# Patient Record
Sex: Female | Born: 1995 | Race: White | Hispanic: No | Marital: Single | Smoking: Current every day smoker
Health system: Southern US, Community
[De-identification: ages and names within clinical notes are randomized; demographics above are authoritative.]

## PROBLEM LIST (undated history)

## (undated) DIAGNOSIS — R000 Tachycardia, unspecified: Secondary | ICD-10-CM

## (undated) DIAGNOSIS — M1990 Unspecified osteoarthritis, unspecified site: Secondary | ICD-10-CM

## (undated) HISTORY — PX: KNEE SURGERY: SHX244

---
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
## 2015-07-20 ENCOUNTER — Encounter (HOSPITAL_COMMUNITY): Payer: Self-pay | Admitting: Emergency Medicine

## 2015-07-20 ENCOUNTER — Emergency Department (HOSPITAL_COMMUNITY): Payer: Medicaid Other

## 2015-07-20 ENCOUNTER — Emergency Department (HOSPITAL_COMMUNITY)
Admission: EM | Admit: 2015-07-20 | Discharge: 2015-07-20 | Disposition: A | Discharge status: Home / Self Care | Payer: Medicaid Other | Attending: Emergency Medicine | Admitting: Emergency Medicine

## 2015-07-20 DIAGNOSIS — Z79899 Other long term (current) drug therapy: Secondary | ICD-10-CM | POA: Insufficient documentation

## 2015-07-20 DIAGNOSIS — J029 Acute pharyngitis, unspecified: Secondary | ICD-10-CM | POA: Insufficient documentation

## 2015-07-20 DIAGNOSIS — R509 Fever, unspecified: Secondary | ICD-10-CM | POA: Insufficient documentation

## 2015-07-20 DIAGNOSIS — R6889 Other general symptoms and signs: Secondary | ICD-10-CM

## 2015-07-20 DIAGNOSIS — M1990 Unspecified osteoarthritis, unspecified site: Secondary | ICD-10-CM | POA: Insufficient documentation

## 2015-07-20 DIAGNOSIS — Z3202 Encounter for pregnancy test, result negative: Secondary | ICD-10-CM | POA: Insufficient documentation

## 2015-07-20 DIAGNOSIS — R112 Nausea with vomiting, unspecified: Secondary | ICD-10-CM | POA: Insufficient documentation

## 2015-07-20 DIAGNOSIS — R0981 Nasal congestion: Secondary | ICD-10-CM | POA: Insufficient documentation

## 2015-07-20 DIAGNOSIS — R05 Cough: Secondary | ICD-10-CM | POA: Insufficient documentation

## 2015-07-20 DIAGNOSIS — R197 Diarrhea, unspecified: Secondary | ICD-10-CM | POA: Insufficient documentation

## 2015-07-20 LAB — COMPREHENSIVE METABOLIC PANEL
ALBUMIN: 3.8 g/dL (ref 3.5–5.0)
ALT: 17 U/L (ref 14–54)
AST: 19 U/L (ref 15–41)
Alkaline Phosphatase: 75 U/L (ref 38–126)
Anion gap: 11 (ref 5–15)
BUN: 8 mg/dL (ref 6–20)
CHLORIDE: 104 mmol/L (ref 101–111)
CO2: 26 mmol/L (ref 22–32)
CREATININE: 0.78 mg/dL (ref 0.44–1.00)
Calcium: 9.2 mg/dL (ref 8.9–10.3)
GFR calc Af Amer: >60 mL/min (ref >60)
GLUCOSE: 99 mg/dL (ref 65–99)
POTASSIUM: 4.4 mmol/L (ref 3.5–5.1)
SODIUM: 141 mmol/L (ref 135–145)
Total Bilirubin: 0.4 mg/dL (ref 0.3–1.2)
Total Protein: 6.5 g/dL (ref 6.5–8.1)

## 2015-07-20 LAB — CBC
HEMATOCRIT: 40.9 % (ref 36.0–46.0)
Hemoglobin: 13.8 g/dL (ref 12.0–15.0)
MCH: 29.7 pg (ref 26.0–34.0)
MCHC: 33.7 g/dL (ref 30.0–36.0)
MCV: 88 fL (ref 78.0–100.0)
PLATELETS: 250 10*3/uL (ref 150–400)
RBC: 4.65 MIL/uL (ref 3.87–5.11)
RDW: 12.3 % (ref 11.5–15.5)
WBC: 6.5 10*3/uL (ref 4.0–10.5)

## 2015-07-20 LAB — URINALYSIS, ROUTINE W REFLEX MICROSCOPIC
Bilirubin Urine: NEGATIVE
GLUCOSE, UA: NEGATIVE mg/dL
HGB URINE DIPSTICK: NEGATIVE
KETONES UR: NEGATIVE mg/dL
LEUKOCYTES UA: NEGATIVE
Nitrite: NEGATIVE
PH: 6 (ref 5.0–8.0)
Protein, ur: NEGATIVE mg/dL
Specific Gravity, Urine: 1.012 (ref 1.005–1.030)

## 2015-07-20 LAB — I-STAT CG4 LACTIC ACID, ED: LACTIC ACID, VENOUS: 0.49 mmol/L — AB (ref 0.5–2.0)

## 2015-07-20 LAB — I-STAT BETA HCG BLOOD, ED (MC, WL, AP ONLY): I-stat hCG, quantitative: <5 m[IU]/mL (ref <5)

## 2015-07-20 LAB — RAPID STREP SCREEN (MED CTR MEBANE ONLY): STREPTOCOCCUS, GROUP A SCREEN (DIRECT): NEGATIVE

## 2015-07-20 LAB — LIPASE, BLOOD: LIPASE: 25 U/L (ref 11–51)

## 2015-07-20 MED ORDER — IBUPROFEN 800 MG PO TABS: 800.0000 mg | ORAL_TABLET | Freq: Three times a day (TID) | ORAL | Status: DC

## 2015-07-20 MED ORDER — ONDANSETRON 4 MG PO TBDP: 4.0000 mg | ORAL_TABLET | Freq: Three times a day (TID) | ORAL | Status: DC | PRN

## 2015-07-20 MED ORDER — BENZONATATE 100 MG PO CAPS
100.0000 mg | ORAL_CAPSULE | Freq: Once | ORAL | Status: AC
  Administered 2015-07-20: 100 mg via ORAL
  Filled 2015-07-20: qty 1

## 2015-07-20 MED ORDER — ONDANSETRON 4 MG PO TBDP
4.0000 mg | ORAL_TABLET | Freq: Once | ORAL | Status: AC
Start: 2015-07-20 — End: 2015-07-20
  Administered 2015-07-20: 4 mg via ORAL
  Filled 2015-07-20: qty 1

## 2015-07-20 MED ORDER — BENZONATATE 100 MG PO CAPS: 100.0000 mg | ORAL_CAPSULE | Freq: Three times a day (TID) | ORAL | Status: DC

## 2015-07-20 NOTE — ED Notes (Signed)
Pt sts fever and body aches with N/V starting today

## 2015-07-20 NOTE — Discharge Instructions (Signed)
You have been seen today for flulike symptoms. Your imaging and lab tests showed no abnormalities. Your symptoms are consistent with a viral illness. Viruses do not require antibiotics. Treatment is symptomatic care. Drink plenty of fluids and get plenty of rest. Ibuprofen or Tylenol for pain or fever. Zofran for nausea. Tessalon for cough. Warm liquids or Chloraseptic spray may help soothe the sore throat. Follow up with PCP as needed. Return to ED should symptoms worsen.

## 2015-07-20 NOTE — ED Provider Notes (Signed)
CSN: [id number]     Arrival date & time 07/20/15  1806 History  By signing my name below, I, Lyndel Safe, attest that this documentation has been prepared under the direction and in the presence of Jalisia Puchalski, PA-C. Electronically Signed: Lyndel Safe, ED Scribe. 07/20/2015. 8:13 PM.  Chief Complaint  Patient presents with  . Fever  . Emesis   The history is provided by the patient. No language interpreter was used.   HPI Comments: Virginia [name] is a 20 y.o. female who presents to the Emergency Department complaining of a fever with a Tmax of 102 F onset this morning with waking up. Pt reports she has been experiencing nasal congestion and a cough over the past week and she associates a sore throat, diarrhea, nausea and vomiting, with 3-4 episodes of emesis over the past 24 hours. She has taken multiple OTC cold and flu medications without long term relief. She denies urinary symptoms, rashes, abdominal pain, or any other complaints. Pt able to tolerate secretions without difficulty. No trouble breathing.   Past Medical History  Diagnosis Date  . Arthritis   . Tachycardia    Past Surgical History  Procedure Laterality Date  . Knee surgery     History reviewed. No pertinent family history. Social History  Substance Use Topics  . Smoking status: Never Smoker   . Smokeless tobacco: None  . Alcohol Use: No   OB History    No data available     Review of Systems  Constitutional: Positive for fever.  HENT: Positive for sore throat. Negative for trouble swallowing and voice change.   Respiratory: Positive for cough. Negative for shortness of breath.   Gastrointestinal: Positive for nausea, vomiting and diarrhea.  Genitourinary: Negative for dysuria, urgency, frequency and hematuria.  Skin: Negative for rash.  All other systems reviewed and are negative.  Allergies  Ceclor  Home Medications   Prior to Admission medications   Medication Sig Start Date End Date Taking?  Authorizing Provider  benzonatate (TESSALON) 100 MG capsule Take 1 capsule (100 mg total) by mouth every 8 (eight) hours. 07/20/15   Ramaj Frangos C Kismet Facemire, PA-C  ibuprofen (ADVIL,MOTRIN) 600 MG tablet Take 600 mg by mouth every 6 (six) hours as needed. For pain    Historical Provider, MD  ibuprofen (ADVIL,MOTRIN) 800 MG tablet Take 1 tablet (800 mg total) by mouth 3 (three) times daily. 07/20/15   Aracelie Addis C Ariyanna Oien, PA-C  ondansetron (ZOFRAN ODT) 4 MG disintegrating tablet Take 1 tablet (4 mg total) by mouth every 8 (eight) hours as needed for nausea or vomiting. 07/20/15   Anselm Pancoast, PA-C  Pediatric Multivit-Minerals-C (GUMMI BEAR MULTIVITAMIN/MIN) CHEW Chew 2 each by mouth daily.    Historical Provider, MD  Rayburn Ma Oil (STYE) 31.9-57.7 % OINT Apply 1 application to eye daily as needed. For stye    Historical Provider, MD   BP 118/74 mmHg  Pulse 103  Temp(Src) 98.7 F (37.1 C) (Oral)  Resp 18  SpO2 99% Physical Exam  Constitutional: She is oriented to person, place, and time. She appears well-developed and well-nourished. No distress.  HENT:  Head: Normocephalic.  Mouth/Throat: Oropharynx is clear and moist. No oropharyngeal exudate, posterior oropharyngeal edema or posterior oropharyngeal erythema.  Oropharynx clear of edema, erythema and exudate.   Eyes: Conjunctivae are normal.  Neck: Normal range of motion. Neck supple.  Cardiovascular: Normal rate, regular rhythm and normal heart sounds.   No peripheral edema.   Pulmonary/Chest: Effort normal and  breath sounds normal. No respiratory distress. She has no wheezes. She has no rales.  Abdominal: Soft. There is no tenderness.  Musculoskeletal: Normal range of motion.  Lymphadenopathy:    She has no cervical adenopathy.  Neurological: She is alert and oriented to person, place, and time. Coordination normal.  Skin: Skin is warm.  Psychiatric: She has a normal mood and affect. Her behavior is normal.  Nursing note and vitals  reviewed.   ED Course  Procedures  DIAGNOSTIC STUDIES: Oxygen Saturation is 99% on RA, normal by my interpretation.    COORDINATION OF CARE: 7:34 PM Discussed treatment plan which includes to order symptomatic relief and chest Xray with pt. Pt acknowledges and agrees to plan.   Labs Review Labs Reviewed  I-STAT CG4 LACTIC ACID, ED - Abnormal; Notable for the following:    Lactic Acid, Venous 0.49 (*)    All other components within normal limits  RAPID STREP SCREEN (NOT AT [facility])  CULTURE, [facility]REP ([facility])  LIPASE, BLOOD  COMPREHENSIVE METABOLIC PANEL  CBC  URINALYSIS, ROUTINE W REFLEX MICROSCOPIC (NOT AT [facility])  I-STAT BETA HCG BLOOD, ED ([facility], [facility], [facility] ONLY)  I-STAT CG4 LACTIC ACID, ED    Imaging Review Dg Chest 2 View  07/20/2015  CLINICAL DATA:  Cough and fever for 1 week. EXAM: CHEST  2 VIEW COMPARISON:  None. FINDINGS: The cardiomediastinal contours are normal. The lungs are clear. Pulmonary vasculature is normal. No consolidation, pleural effusion, or pneumothorax. No acute osseous abnormalities are seen. IMPRESSION: No acute pulmonary process. Electronically Signed   By: Rubye Oaks M.D.   On: 07/20/2015 19:59   I have personally reviewed and evaluated these images and lab results as part of my medical decision-making.   MDM   Final diagnoses:  Flu-like symptoms    Virginia [name] presents with fever, nausea, vomiting, sore throat and diarrhea since this morning. Cough for the last week.  This patient's presentation is consistent with a viral illness, such as influenza. Patient is nontoxic appearing, afebrile, not tachycardic, not tachypneic, maintains SPO2 of 99% on room air, and is in no apparent distress. Patient has no signs of sepsis or other serious or life-threatening condition. Patient improved with conservative management here in the ED. The patient was given instructions for home care as well as return precautions. Patient voices understanding of these  instructions, accepts the plan, and is comfortable with discharge.  Filed Vitals:   07/20/15 1812 07/20/15 1817 07/20/15 2129  BP:  118/74 113/71  Pulse: 103  99  Temp: 98.7 F (37.1 C)  98.5 F (36.9 C)  TempSrc: Oral  Oral  Resp: 18  18  SpO2: 99%  99%     I personally performed the services described in this documentation, which was scribed in my presence. The recorded information has been reviewed and is accurate.   Anselm Pancoast, PA-C 07/20/15 2359  Nelva Nay, MD 07/23/15 1351

## 2015-07-23 LAB — CULTURE, GROUP A STREP (THRC)

## 2015-12-23 ENCOUNTER — Emergency Department (HOSPITAL_COMMUNITY)
Admission: EM | Admit: 2015-12-23 | Discharge: 2015-12-23 | Disposition: A | Discharge status: Home / Self Care | Payer: Medicaid Other | Attending: Dermatology | Admitting: Dermatology

## 2015-12-23 ENCOUNTER — Encounter (HOSPITAL_COMMUNITY): Payer: Self-pay

## 2015-12-23 DIAGNOSIS — R1110 Vomiting, unspecified: Secondary | ICD-10-CM | POA: Insufficient documentation

## 2015-12-23 DIAGNOSIS — Z5321 Procedure and treatment not carried out due to patient leaving prior to being seen by health care provider: Secondary | ICD-10-CM | POA: Insufficient documentation

## 2015-12-23 LAB — URINALYSIS, ROUTINE W REFLEX MICROSCOPIC
Bilirubin Urine: NEGATIVE
Glucose, UA: NEGATIVE mg/dL
Hgb urine dipstick: NEGATIVE
KETONES UR: 15 mg/dL — AB
LEUKOCYTES UA: NEGATIVE
NITRITE: NEGATIVE
PH: 7 (ref 5.0–8.0)
Protein, ur: NEGATIVE mg/dL
SPECIFIC GRAVITY, URINE: 1.021 (ref 1.005–1.030)

## 2015-12-23 LAB — COMPREHENSIVE METABOLIC PANEL
ALK PHOS: 55 U/L (ref 38–126)
ALT: 16 U/L (ref 14–54)
ANION GAP: 8 (ref 5–15)
AST: 16 U/L (ref 15–41)
Albumin: 4.1 g/dL (ref 3.5–5.0)
BILIRUBIN TOTAL: 0.7 mg/dL (ref 0.3–1.2)
BUN: 10 mg/dL (ref 6–20)
CALCIUM: 9.2 mg/dL (ref 8.9–10.3)
CO2: 24 mmol/L (ref 22–32)
Chloride: 107 mmol/L (ref 101–111)
Creatinine, Ser: 0.76 mg/dL (ref 0.44–1.00)
Glucose, Bld: 97 mg/dL (ref 65–99)
POTASSIUM: 3.8 mmol/L (ref 3.5–5.1)
Sodium: 139 mmol/L (ref 135–145)
TOTAL PROTEIN: 6.8 g/dL (ref 6.5–8.1)

## 2015-12-23 LAB — CBC
HEMATOCRIT: 40.5 % (ref 36.0–46.0)
HEMOGLOBIN: 13.2 g/dL (ref 12.0–15.0)
MCH: 29.2 pg (ref 26.0–34.0)
MCHC: 32.6 g/dL (ref 30.0–36.0)
MCV: 89.6 fL (ref 78.0–100.0)
Platelets: 298 10*3/uL (ref 150–400)
RBC: 4.52 MIL/uL (ref 3.87–5.11)
RDW: 12.3 % (ref 11.5–15.5)
WBC: 9.2 10*3/uL (ref 4.0–10.5)

## 2015-12-23 LAB — LIPASE, BLOOD: Lipase: 25 U/L (ref 11–51)

## 2015-12-23 LAB — POC URINE PREG, ED: PREG TEST UR: NEGATIVE

## 2015-12-23 MED ORDER — ONDANSETRON 4 MG PO TBDP
4.0000 mg | ORAL_TABLET | Freq: Once | ORAL | Status: AC | PRN
  Administered 2015-12-23: 4 mg via ORAL

## 2015-12-23 MED ORDER — ONDANSETRON 4 MG PO TBDP
ORAL_TABLET | ORAL | Status: DC
Start: 2015-12-23 — End: 2015-12-24
  Filled 2015-12-23: qty 1

## 2015-12-23 NOTE — ED Triage Notes (Signed)
Pt states emesis x 2 weeks. States syncopal episodes yesterday. Pt denies any abdominal pain or urinary symptoms.

## 2015-12-23 NOTE — ED Notes (Signed)
Patient left stated that she no longer wanted to be seen.Virginia [name]

## 2016-03-19 ENCOUNTER — Emergency Department (HOSPITAL_COMMUNITY)
Admission: EM | Admit: 2016-03-19 | Discharge: 2016-03-20 | Disposition: A | Discharge status: Home / Self Care | Payer: Medicaid Other | Attending: Emergency Medicine | Admitting: Emergency Medicine

## 2016-03-19 ENCOUNTER — Encounter (HOSPITAL_COMMUNITY): Payer: Self-pay

## 2016-03-19 DIAGNOSIS — A6004 Herpesviral vulvovaginitis: Secondary | ICD-10-CM | POA: Insufficient documentation

## 2016-03-19 LAB — WET PREP, GENITAL
Sperm: NONE SEEN
TRICH WET PREP: NONE SEEN
Yeast Wet Prep HPF POC: NONE SEEN

## 2016-03-19 MED ORDER — VALACYCLOVIR HCL 500 MG PO TABS
1000.0000 mg | ORAL_TABLET | Freq: Once | ORAL | Status: AC
  Administered 2016-03-19: 1000 mg via ORAL
  Filled 2016-03-19: qty 2

## 2016-03-19 MED ORDER — AZITHROMYCIN 1 G PO PACK
2.0000 g | PACK | Freq: Once | ORAL | Status: AC
  Administered 2016-03-19: 2 g via ORAL
  Filled 2016-03-19: qty 2

## 2016-03-19 MED ORDER — ACYCLOVIR 200 MG PO CAPS: 200.0000 mg | ORAL_CAPSULE | Freq: Every day | ORAL | 0 refills | Status: DC

## 2016-03-19 NOTE — ED Provider Notes (Addendum)
[facility]-EMERGENCY DEPT Provider Note   CSN: [id number] Arrival date & time: 03/19/16  1914  By signing my name below, I, [name], attest that this documentation has been prepared under the direction and in the presence of Tomasita Crumble, MD . Electronically Signed: [name], Scribe. 03/19/2016. 11:11 PM.  History   Chief Complaint Chief Complaint  Patient presents with  . SEXUALLY TRANSMITTED DISEASE    The history is provided by the patient and medical records. No language interpreter was used.    HPI Comments:  Virginia [name] is a 20 y.o. female who presents to the Emergency Department complaining of a rash on her pelvic area onset last night.  Pt states that she shaved her legs last night and had a razor burn, but when woke up it had progressed to painful blisters.  Pt reports that her boyfriend had unprotected sex with a woman who was diagnosed with herpes.  She is concerned he may have spread it to her.  She denies drainage from the site, abnormal vaginal discharge and abdominal pain.  Pt has a history of abnormal periods.  Past Medical History:  Diagnosis Date  . Arthritis   . Tachycardia     There are no active problems to display for this patient.   Past Surgical History:  Procedure Laterality Date  . KNEE SURGERY      OB History    No data available       Home Medications    Prior to Admission medications   Medication Sig Start Date End Date Taking? Authorizing Provider  acetaminophen (TYLENOL) 500 MG tablet Take 1,000 mg by mouth every 6 (six) hours as needed for mild pain.   Yes Historical Provider, MD  ibuprofen (ADVIL,MOTRIN) 600 MG tablet Take 600 mg by mouth every 6 (six) hours as needed. For pain   Yes Historical Provider, MD  Prenatal Vit-Fe Fumarate-FA (PRENATAL MULTIVITAMIN) TABS tablet Take 1 tablet by mouth daily at 12 noon.   Yes Historical Provider, MD  benzonatate (TESSALON) 100 MG capsule Take 1 capsule (100 mg total) by mouth  every 8 (eight) hours. Patient not taking: Reported on 03/19/2016 07/20/15   Shawn C Joy, PA-C  ibuprofen (ADVIL,MOTRIN) 800 MG tablet Take 1 tablet (800 mg total) by mouth 3 (three) times daily. Patient not taking: Reported on 03/19/2016 07/20/15   Shawn C Joy, PA-C  ondansetron (ZOFRAN ODT) 4 MG disintegrating tablet Take 1 tablet (4 mg total) by mouth every 8 (eight) hours as needed for nausea or vomiting. Patient not taking: Reported on 03/19/2016 07/20/15   Anselm Pancoast, PA-C    Family History History reviewed. No pertinent family history.  Social History Social History  Substance Use Topics  . Smoking status: Never Smoker  . Smokeless tobacco: Never Used  . Alcohol use No     Allergies   Ceclor [cefaclor] and Coconut oil   Review of Systems Review of Systems  Gastrointestinal: Negative for abdominal pain.  Genitourinary: Negative for vaginal discharge.  Skin: Positive for rash.  All other systems reviewed and are negative.    Physical Exam Updated Vital Signs BP 114/77 (BP Location: Right Arm)   Pulse (!) 125   Temp 99.4 F (37.4 C) (Oral)   Resp 18   Ht 5\' 4"  (1.626 m)   Wt 163 lb 8 oz (74.2 kg)   SpO2 98%   BMI 28.06 kg/m   Physical Exam  Constitutional: She is oriented to person, place, and time. She  appears well-developed and well-nourished. She appears distressed.  Tearful and distressed.  HENT:  Head: Normocephalic and atraumatic.  Nose: Nose normal.  Mouth/Throat: Oropharynx is clear and moist. No oropharyngeal exudate.  Eyes: Conjunctivae and EOM are normal. Pupils are equal, round, and reactive to light. No scleral icterus.  Neck: Normal range of motion. Neck supple. No JVD present. No tracheal deviation present. No thyromegaly present.  Cardiovascular: Normal rate, regular rhythm and normal heart sounds.  Exam reveals no gallop and no friction rub.   No murmur heard. Pulmonary/Chest: Effort normal and breath sounds normal. No respiratory distress.  She has no wheezes. She exhibits no tenderness.  Abdominal: Soft. Bowel sounds are normal. She exhibits no distension and no mass. There is no tenderness. There is no rebound and no guarding.  Musculoskeletal: Normal range of motion. She exhibits no edema or tenderness.  Lymphadenopathy:    She has no cervical adenopathy.  Neurological: She is alert and oriented to person, place, and time. No cranial nerve deficit. She exhibits normal muscle tone.  Skin: Skin is warm and dry. No rash noted. No erythema. No pallor.  Group blisters on an erythematous base at the posterior labial commissure.  Tender to palpation.    Nursing note and vitals reviewed.    ED Treatments / Results   DIAGNOSTIC STUDIES:  Oxygen Saturation is 98% on RA, NML by my interpretation.    COORDINATION OF CARE:  11:08 PM Discussed treatment plan with pt at bedside and pt agreed to plan.  Labs (all labs ordered are listed, but only abnormal results are displayed) Labs Reviewed - No data to display  EKG  EKG Interpretation None       Radiology No results found.  Procedures Procedures (including critical care time)  Medications Ordered in ED Medications - No data to display   Initial Impression / Assessment and Plan / ED Course  I have reviewed the triage vital signs and the nursing notes.  Pertinent labs & imaging results that were available during my care of the patient were reviewed by me and considered in my medical decision making (see chart for details).  Clinical Course    Patient presents to the ED for rash in her groin.  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  Education provided.  Will DC with acyclovir as patietn does not have insurance.  PCP follow up advised.  Patient treated for GC/GL as well.  She has clue cells but is asymptomatic, will hold flagyl for now. She appears well and in NAD. She is tachycardic, but also tearful and very anxious in the room.  VS otherwise normal. She is safe  for Dc.  Final Clinical Impressions(s) / ED Diagnoses   Final diagnoses:  None    New Prescriptions New Prescriptions   No medications on file   I personally performed the services described in this documentation, which was scribed in my presence. The recorded information has been reviewed and is accurate.      Tomasita CrumbleAdeleke Eular Panek, MD 03/20/16 [id number]    Tomasita CrumbleAdeleke Majorie Santee, MD 03/20/16 [id number]

## 2016-03-19 NOTE — Progress Notes (Signed)
EDCM spoke to patient at bedside. Patient [place]  [facility], informed patient of services there and walk in times.  EDCM also provided patient with list of pcps who accept self pay patients, list of discount pharmacies and websites needymeds.org and GoodRX.com for medication assistance, phone number to inquire about the orange card, phone number to inquire about Medicaid, phone number to inquire about the Affordable Care Act, financial resources in the community such as local churches, [employer], urban ministries, and dental assistance for uninsured patients.  Patient thankful for resources.  No further EDCM needs at this time.  [facility]EDCM asked Dr. Mora Bellmanni if patient can have a ginger ale.  Patient provided ginger ale.

## 2016-03-19 NOTE — ED Triage Notes (Signed)
Pt states that she was shaving the other day and went to bed with little bumps but the next day she had painful blisters

## 2016-03-20 LAB — GC/CHLAMYDIA PROBE AMP (~~LOC~~) NOT AT ARMC
CHLAMYDIA, DNA PROBE: NEGATIVE
NEISSERIA GONORRHEA: NEGATIVE

## 2016-03-20 MED ORDER — ACYCLOVIR 200 MG PO CAPS: 200.0000 mg | ORAL_CAPSULE | Freq: Every day | ORAL | 0 refills | Status: AC

## 2016-03-20 MED ORDER — ACYCLOVIR 200 MG PO CAPS: 200.0000 mg | ORAL_CAPSULE | Freq: Every day | ORAL | 0 refills | Status: DC

## 2016-03-21 LAB — HSV(HERPES SIMPLEX VRS) I + II AB-IGG: HSV 2 Glycoprotein G Ab, IgG: <0.91 index (ref 0.00–0.90)

## 2016-03-21 LAB — HIV ANTIBODY (ROUTINE TESTING W REFLEX): HIV Screen 4th Generation wRfx: NONREACTIVE

## 2016-03-23 LAB — HSV CULTURE AND TYPING

## 2016-04-05 ENCOUNTER — Encounter (HOSPITAL_COMMUNITY): Payer: Self-pay

## 2016-04-05 ENCOUNTER — Inpatient Hospital Stay (HOSPITAL_COMMUNITY)
Admission: AD | Admit: 2016-04-05 | Discharge: 2016-04-05 | Disposition: A | Discharge status: Home / Self Care | Payer: Medicaid Other | Source: Ambulatory Visit | Attending: Obstetrics and Gynecology | Admitting: Obstetrics and Gynecology

## 2016-04-05 DIAGNOSIS — N946 Dysmenorrhea, unspecified: Secondary | ICD-10-CM

## 2016-04-05 LAB — URINE MICROSCOPIC-ADD ON

## 2016-04-05 LAB — URINALYSIS, ROUTINE W REFLEX MICROSCOPIC
BILIRUBIN URINE: NEGATIVE
Glucose, UA: NEGATIVE mg/dL
KETONES UR: NEGATIVE mg/dL
Leukocytes, UA: NEGATIVE
NITRITE: NEGATIVE
PROTEIN: NEGATIVE mg/dL
Specific Gravity, Urine: 1.02 (ref 1.005–1.030)
pH: 6 (ref 5.0–8.0)

## 2016-04-05 LAB — POCT PREGNANCY, URINE: PREG TEST UR: NEGATIVE

## 2016-04-05 NOTE — MAU Note (Signed)
Patient woke up this morning with cramping went to the bathroom urinated passed a blood clot, about 1 hour ago had a lot of blood in underwear, still cramping.

## 2016-04-05 NOTE — MAU Provider Note (Signed)
History   null gravida in with vag bleeding and cramping. Periods are irregular, pt states she is trying to get pregnant. Was afraid she might be miscarrying since her period was late.  CSN: [id number]  Arrival date & time 04/05/16  1333   None     Chief Complaint  Patient presents with  . Vaginal Bleeding    HPI  Past Medical History:  Diagnosis Date  . Arthritis   . Tachycardia     Past Surgical History:  Procedure Laterality Date  . KNEE SURGERY      No family history on file.  Social History  Substance Use Topics  . Smoking status: Never Smoker  . Smokeless tobacco: Never Used  . Alcohol use No    OB History    Gravida Para Term Preterm AB Living   1             SAB TAB Ectopic Multiple Live Births                  Review of Systems  Constitutional: Negative.   HENT: Negative.   Eyes: Negative.   Respiratory: Negative.   Cardiovascular: Negative.   Gastrointestinal: Positive for abdominal pain.  Endocrine: Negative.   Genitourinary: Positive for vaginal bleeding.  Musculoskeletal: Negative.   Skin: Negative.   Allergic/Immunologic: Negative.   Neurological: Negative.   Hematological: Negative.   Psychiatric/Behavioral: Negative.     Allergies  Ceclor [cefaclor] and Coconut oil  Home Medications    BP 127/79 (BP Location: Right Arm)   Pulse 104   Temp 98.7 F (37.1 C)   Resp 18   Ht 5\' 4"  (1.626 m)   Wt 163 lb (73.9 kg)   LMP 10/28/2015 (Approximate)   BMI 27.98 kg/m   Physical Exam  Constitutional: She is oriented to person, place, and time. She appears well-developed and well-nourished.  HENT:  Head: Normocephalic.  Eyes: Pupils are equal, round, and reactive to light.  Neck: Normal range of motion.  Cardiovascular: Normal rate, regular rhythm, normal heart sounds and intact distal pulses.   Pulmonary/Chest: Effort normal and breath sounds normal.  Abdominal: Soft.  Genitourinary: Vagina normal and uterus normal.   Musculoskeletal: Normal range of motion.  Neurological: She is alert and oriented to person, place, and time. She has normal reflexes.  Skin: Skin is warm and dry.  Psychiatric: She has a normal mood and affect. Her behavior is normal. Judgment and thought content normal.    MAU Course  Procedures (including critical care time)  Labs Reviewed  URINALYSIS, ROUTINE W REFLEX MICROSCOPIC (NOT AT [facility])  POCT PREGNANCY, URINE   No results found.   No diagnosis found. dysmenorrhea   MDM  Pt neg. Stressed importance of PNV and not smoking in pregnancy. Reviewed with pt most likely time in her cucle where she could become preg. Bleeding sm amt with exam. Will d/c home

## 2016-04-05 NOTE — Discharge Instructions (Signed)

## 2017-04-17 ENCOUNTER — Encounter (HOSPITAL_COMMUNITY): Payer: Self-pay | Admitting: Emergency Medicine

## 2017-04-17 ENCOUNTER — Emergency Department (HOSPITAL_COMMUNITY)
Admission: EM | Admit: 2017-04-17 | Discharge: 2017-04-18 | Disposition: A | Discharge status: Home / Self Care | Payer: Self-pay | Attending: Emergency Medicine | Admitting: Emergency Medicine

## 2017-04-17 DIAGNOSIS — F17200 Nicotine dependence, unspecified, uncomplicated: Secondary | ICD-10-CM | POA: Insufficient documentation

## 2017-04-17 DIAGNOSIS — N739 Female pelvic inflammatory disease, unspecified: Secondary | ICD-10-CM | POA: Insufficient documentation

## 2017-04-17 DIAGNOSIS — Z79899 Other long term (current) drug therapy: Secondary | ICD-10-CM | POA: Insufficient documentation

## 2017-04-17 DIAGNOSIS — A5901 Trichomonal vulvovaginitis: Secondary | ICD-10-CM | POA: Insufficient documentation

## 2017-04-17 DIAGNOSIS — N730 Acute parametritis and pelvic cellulitis: Secondary | ICD-10-CM

## 2017-04-17 LAB — URINALYSIS, ROUTINE W REFLEX MICROSCOPIC
Bilirubin Urine: NEGATIVE
GLUCOSE, UA: NEGATIVE mg/dL
Hgb urine dipstick: NEGATIVE
KETONES UR: NEGATIVE mg/dL
NITRITE: NEGATIVE
PROTEIN: NEGATIVE mg/dL
Specific Gravity, Urine: 1.029 (ref 1.005–1.030)
pH: 5 (ref 5.0–8.0)

## 2017-04-17 LAB — I-STAT BETA HCG BLOOD, ED (MC, WL, AP ONLY)

## 2017-04-17 LAB — COMPREHENSIVE METABOLIC PANEL
ALBUMIN: 3.6 g/dL (ref 3.5–5.0)
ALK PHOS: 52 U/L (ref 38–126)
ALT: 10 U/L — AB (ref 14–54)
ANION GAP: 7 (ref 5–15)
AST: 16 U/L (ref 15–41)
BILIRUBIN TOTAL: 0.9 mg/dL (ref 0.3–1.2)
BUN: 12 mg/dL (ref 6–20)
CALCIUM: 8.8 mg/dL — AB (ref 8.9–10.3)
CO2: 25 mmol/L (ref 22–32)
CREATININE: 0.74 mg/dL (ref 0.44–1.00)
Chloride: 105 mmol/L (ref 101–111)
GFR calc Af Amer: >60 mL/min (ref >60)
GFR calc non Af Amer: >60 mL/min (ref >60)
GLUCOSE: 119 mg/dL — AB (ref 65–99)
Potassium: 4.1 mmol/L (ref 3.5–5.1)
Sodium: 137 mmol/L (ref 135–145)
TOTAL PROTEIN: 6 g/dL — AB (ref 6.5–8.1)

## 2017-04-17 LAB — CBC
HCT: 40.3 % (ref 36.0–46.0)
Hemoglobin: 13.6 g/dL (ref 12.0–15.0)
MCH: 31 pg (ref 26.0–34.0)
MCHC: 33.7 g/dL (ref 30.0–36.0)
MCV: 91.8 fL (ref 78.0–100.0)
Platelets: 292 10*3/uL (ref 150–400)
RBC: 4.39 MIL/uL (ref 3.87–5.11)
RDW: 12.7 % (ref 11.5–15.5)
WBC: 12.6 10*3/uL — ABNORMAL HIGH (ref 4.0–10.5)

## 2017-04-17 NOTE — ED Triage Notes (Signed)
Patient arrives with complaint of vaginal discharge for 1 week. Now has accompanying pain which began 2 days ago. States she took medication for yeast infection with out result. Endorses subjective fever a few days ago which she took a tylenol for. Endorses some burning and itching with urination at times, but its intermittent.

## 2017-04-18 LAB — WET PREP, GENITAL
SPERM: NONE SEEN
Yeast Wet Prep HPF POC: NONE SEEN

## 2017-04-18 MED ORDER — IBUPROFEN 600 MG PO TABS: 600.0000 mg | ORAL_TABLET | Freq: Four times a day (QID) | ORAL | 0 refills | Status: DC | PRN

## 2017-04-18 MED ORDER — LEVOFLOXACIN 500 MG PO TABS
500.0000 mg | ORAL_TABLET | Freq: Once | ORAL | Status: AC
  Administered 2017-04-18: 500 mg via ORAL
  Filled 2017-04-18: qty 1

## 2017-04-18 MED ORDER — LEVOFLOXACIN 500 MG PO TABS: 500.0000 mg | ORAL_TABLET | Freq: Every day | ORAL | 0 refills | Status: DC

## 2017-04-18 MED ORDER — METRONIDAZOLE 500 MG PO TABS
500.0000 mg | ORAL_TABLET | Freq: Once | ORAL | Status: AC
  Administered 2017-04-18: 500 mg via ORAL
  Filled 2017-04-18: qty 1

## 2017-04-18 MED ORDER — METRONIDAZOLE 500 MG PO TABS: 500.0000 mg | ORAL_TABLET | Freq: Two times a day (BID) | ORAL | 0 refills | Status: DC

## 2017-04-18 NOTE — ED Provider Notes (Signed)
[facility]ENC[facility] Provider Note   CSN: [id number] Arrival date & time: 04/17/17  2023    History   Chief Complaint Chief Complaint  Patient presents with  . Vaginal Discharge  . Pelvic Pain    HPI Virginia [name] is a 21 y.o. female.  21 year old female presents to the emergency department for evaluation of lower abdominal pain.  She reports a cramping in her lower abdomen associated with vaginal discharge.  Pain unrelieved by Tylenol.  Discharge has proceeded her abdominal pain for 2 weeks.  She states that the discharge has been worsening despite over-the-counter medication for management of a yeast infection.  She denies any vaginal bleeding.  She has had dysuria, but this is intermittent.  She further reports mild urinary urgency and frequency.  Patient states that she was last sexually active 1 month ago.  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  No fevers, vomiting, hematuria, hx of abdominal surgeries.   The history is provided by the patient. No language interpreter was used.  Vaginal Discharge    Pelvic Pain     Past Medical History:  Diagnosis Date  . Arthritis   . Tachycardia     There are no active problems to display for this patient.   Past Surgical History:  Procedure Laterality Date  . KNEE SURGERY      OB History    Gravida Para Term Preterm AB Living   0             SAB TAB Ectopic Multiple Live Births                   Home Medications    Prior to Admission medications   Medication Sig Start Date End Date Taking? Authorizing Provider  acetaminophen (TYLENOL) 500 MG tablet Take 1,000 mg by mouth every 6 (six) hours as needed for mild pain.    [provider]  ibuprofen (ADVIL,MOTRIN) 600 MG tablet Take 1 tablet (600 mg total) by mouth every 6 (six) hours as needed. 04/18/17   Antony MaduraHumes, Arvle Grabe, PA-C  levofloxacin (LEVAQUIN) 500 MG tablet Take 1 tablet (500 mg total) by mouth daily. 04/18/17   Antony MaduraHumes,  Akili Corsetti, PA-C  metroNIDAZOLE (FLAGYL) 500 MG tablet Take 1 tablet (500 mg total) by mouth 2 (two) times daily. 04/18/17   Antony MaduraHumes, Harmani Neto, PA-C  Prenatal Vit-Fe Fumarate-FA (PRENATAL MULTIVITAMIN) TABS tablet Take 1 tablet by mouth daily at 12 noon.     [provider]    Family History History reviewed. No pertinent family history.  Social History Social History   Tobacco Use  . Smoking status: Current Every Day Smoker  . Smokeless tobacco: Never Used  Substance Use Topics  . Alcohol use: No  . Drug use: No     Allergies   Ceclor [cefaclor] and Coconut oil   Review of Systems Review of Systems  Genitourinary: Positive for pelvic pain and vaginal discharge.  Ten systems reviewed and are negative for acute change, except as noted in the HPI.    Physical Exam Updated Vital Signs BP 110/63   Pulse 89   Temp 98 F (36.7 C) (Oral)   Resp 19   Ht 5\' 5"  (1.651 m)   Wt 61.7 kg (136 lb)   SpO2 99%   BMI 22.63 kg/m   Physical Exam  Constitutional: She is oriented to person, place, and time. She appears well-developed and well-nourished. No distress.  Nontoxic appearing and in NAD  HENT:  Head: Normocephalic and atraumatic.  Eyes: Conjunctivae and EOM are normal. No scleral icterus.  Neck: Normal range of motion.  Pulmonary/Chest: Effort normal. No respiratory distress.  Respirations even and unlabored  Abdominal: There is tenderness (suprapubic abdomen.).  Soft, nondistended abdomen. No peritoneal signs.  Genitourinary: There is no rash, tenderness or lesion on the right labia. There is no rash, tenderness or lesion on the left labia. Uterus is tender. Cervix exhibits motion tenderness. Cervix exhibits no friability. Right adnexum displays tenderness. Right adnexum displays no mass. Left adnexum displays tenderness. Left adnexum displays no mass. Vaginal discharge (thin, opaque) found.  Musculoskeletal: Normal range of motion.  Neurological: She is alert and oriented  to person, place, and time. She exhibits normal muscle tone. Coordination normal.  Patient moving all extremities.  Skin: Skin is warm and dry. No rash noted. She is not diaphoretic. No erythema. No pallor.  Psychiatric: She has a normal mood and affect. Her behavior is normal.  Nursing note and vitals reviewed.    ED Treatments / Results  Labs (all labs ordered are listed, but only abnormal results are displayed) Labs Reviewed  WET PREP, GENITAL - Abnormal; Notable for the following components:      Result Value   Trich, Wet Prep PRESENT (*)    Clue Cells Wet Prep HPF POC PRESENT (*)    WBC, Wet Prep HPF POC TOO NUMEROUS TO COUNT (*)    All other components within normal limits  COMPREHENSIVE METABOLIC PANEL - Abnormal; Notable for the following components:   Glucose, Bld 119 (*)    Calcium 8.8 (*)    Total Protein 6.0 (*)    ALT 10 (*)    All other components within normal limits  CBC - Abnormal; Notable for the following components:   WBC 12.6 (*)    All other components within normal limits  URINALYSIS, ROUTINE W REFLEX MICROSCOPIC - Abnormal; Notable for the following components:   APPearance CLOUDY (*)    Leukocytes, UA LARGE (*)    Bacteria, UA RARE (*)    Squamous Epithelial / LPF 0-5 (*)    All other components within normal limits  URINE CULTURE  I-STAT BETA HCG BLOOD, ED ([facility], [facility], [facility] ONLY)  GC/CHLAMYDIA PROBE AMP ([place]) NOT AT [facility]    EKG  EKG Interpretation None       Radiology No results found.  Procedures Procedures (including critical care time)  Medications Ordered in ED Medications  levofloxacin (LEVAQUIN) tablet 500 mg (not administered)  metroNIDAZOLE (FLAGYL) tablet 500 mg (not administered)     12:32 AM Given CMT and adnexal tenderness with pyuria and vaginal discharge complaints, will treat for PID.  Patient has a cephalosporin allergy.  For this reason, plan levofloxacin 500 mg daily times 14 days PLUS metronidazole 500 mg twice  a day times 14 days per CDC recommendations.   Initial Impression / Assessment and Plan / ED Course  I have reviewed the triage vital signs and the nursing notes.  Pertinent labs & imaging results that were available during my care of the patient were reviewed by me and considered in my medical decision making (see chart for details).     21 year old female presents to the emergency department for vaginal discharge with associated lower abdominal cramping.  She has positive cervical motion and adnexal tenderness on exam.  Wet prep with too numerous to count white blood cells.  Patient also with pyuria, suspected secondary to STD associated urethritis as  the patient tested positive for trichomonas today.  Gonorrhea and Chlamydia cultures pending.  Patient without signs of acute surgical abdomen.  She is afebrile.  Plan to manage with a course of outpatient antibiotics.  Patient unable to receive IM Rocephin secondary to allergy.  Will provide alternate course of treatment for PID including Levaquin and Flagyl.  Ibuprofen advised for pain and return precautions provided.  Patient discharged in stable condition with no unaddressed concerns.   Final Clinical Impressions(s) / ED Diagnoses   Final diagnoses:  Trichomonal vaginitis  PID (acute pelvic inflammatory disease)    ED Discharge Orders        Ordered    levofloxacin (LEVAQUIN) 500 MG tablet  Daily     04/18/17 0054    metroNIDAZOLE (FLAGYL) 500 MG tablet  2 times daily     04/18/17 0054    ibuprofen (ADVIL,MOTRIN) 600 MG tablet  Every 6 hours PRN     04/18/17 0054       Antony MaduraHumes, Zhyon Antenucci, PA-C 04/18/17 0104    Melene PlanFloyd, Dan, DO 04/18/17 1557

## 2017-04-18 NOTE — Discharge Instructions (Signed)
Your exam today is concerning for pelvic inflammatory disease.  You have been prescribed antibiotics for this reason.  Take Levaquin and Flagyl as prescribed until finished.  Do not stop these antibiotics early.  Do not drink alcohol while taking Flagyl as this will cause you to become violently ill with vomiting.  We advise ibuprofen as prescribed for pain.  Avoid sexual intercourse until 1 week following completion of your antibiotic treatment.  Call the health department in 48 hours to follow-up on the results of your gonorrhea and Chlamydia cultures.  Notify all sexual partners of their need to be tested and treated for STDs as well.  You may return to the emergency department, as needed, for new or concerning symptoms.

## 2017-04-19 LAB — URINE CULTURE: Culture: NO GROWTH

## 2017-04-20 LAB — GC/CHLAMYDIA PROBE AMP (~~LOC~~) NOT AT ARMC
Chlamydia: NEGATIVE
Neisseria Gonorrhea: NEGATIVE

## 2018-01-26 ENCOUNTER — Emergency Department (HOSPITAL_COMMUNITY)
Admission: EM | Admit: 2018-01-26 | Discharge: 2018-01-27 | Disposition: A | Discharge status: Home / Self Care | Payer: Self-pay | Attending: Emergency Medicine | Admitting: Emergency Medicine

## 2018-01-26 ENCOUNTER — Encounter (HOSPITAL_COMMUNITY): Payer: Self-pay

## 2018-01-26 DIAGNOSIS — R569 Unspecified convulsions: Secondary | ICD-10-CM | POA: Insufficient documentation

## 2018-01-26 DIAGNOSIS — F13930 Sedative, hypnotic or anxiolytic use, unspecified with withdrawal, uncomplicated: Secondary | ICD-10-CM | POA: Insufficient documentation

## 2018-01-26 DIAGNOSIS — F13939 Sedative, hypnotic or anxiolytic use, unspecified with withdrawal, unspecified: Secondary | ICD-10-CM

## 2018-01-26 DIAGNOSIS — F13239 Sedative, hypnotic or anxiolytic dependence with withdrawal, unspecified: Secondary | ICD-10-CM

## 2018-01-26 DIAGNOSIS — Z79899 Other long term (current) drug therapy: Secondary | ICD-10-CM | POA: Insufficient documentation

## 2018-01-26 DIAGNOSIS — F17210 Nicotine dependence, cigarettes, uncomplicated: Secondary | ICD-10-CM | POA: Insufficient documentation

## 2018-01-26 LAB — BASIC METABOLIC PANEL
Anion gap: 12 (ref 5–15)
BUN: 6 mg/dL (ref 6–20)
CHLORIDE: 110 mmol/L (ref 98–111)
CO2: 18 mmol/L — AB (ref 22–32)
Calcium: 9.1 mg/dL (ref 8.9–10.3)
Creatinine, Ser: 0.91 mg/dL (ref 0.44–1.00)
GFR calc Af Amer: >60 mL/min (ref >60)
GFR calc non Af Amer: >60 mL/min (ref >60)
Glucose, Bld: 143 mg/dL — ABNORMAL HIGH (ref 70–99)
POTASSIUM: 3.9 mmol/L (ref 3.5–5.1)
SODIUM: 140 mmol/L (ref 135–145)

## 2018-01-26 LAB — CBC
HEMATOCRIT: 42.5 % (ref 36.0–46.0)
HEMOGLOBIN: 14.2 g/dL (ref 12.0–15.0)
MCH: 30.9 pg (ref 26.0–34.0)
MCHC: 33.4 g/dL (ref 30.0–36.0)
MCV: 92.4 fL (ref 78.0–100.0)
Platelets: 315 10*3/uL (ref 150–400)
RBC: 4.6 MIL/uL (ref 3.87–5.11)
RDW: 11.9 % (ref 11.5–15.5)
WBC: 11.3 10*3/uL — AB (ref 4.0–10.5)

## 2018-01-26 LAB — CBG MONITORING, ED: Glucose-Capillary: 139 mg/dL — ABNORMAL HIGH (ref 70–99)

## 2018-01-26 LAB — I-STAT BETA HCG BLOOD, ED (MC, WL, AP ONLY): I-stat hCG, quantitative: <5 m[IU]/mL (ref <5)

## 2018-01-26 NOTE — ED Triage Notes (Signed)
Pt coming from home by ems. Pt was talking to friend that pt lives with when she started to have a seizure that lasted for 7-10 minutes. Pt did have one last week and was not taken to the hospital to be evaluated. When pt awoke pt was disoriented slurring her words. Pt is currently axox3.

## 2018-01-26 NOTE — ED Provider Notes (Signed)
[facility] EMERGENCY DEPARTMENT Provider Note   CSN: [id number] Arrival date & time: 01/26/18  2223     History   Chief Complaint Chief Complaint  Patient presents with  . Seizures    HPI Virginia [name] is a 22 y.o. female.  HPI  This is a 22 year old female who presents with reported seizure activity.  Patient provides the history.  She states that she is unsure of exactly what happened today.  She was told that she "had a seizure" in front of her friend.  Reportedly last 7 to 10 minutes.  She reports similar episode last week.  No prior episodes or history of seizure.  Patient denies any recent infectious symptoms.  She is currently only complaining of a mild headache.  Per nursing, patient was slightly confused when she got here with slurring of her words but is now at her baseline.  Patient does report that she buys Xanax off the street.  She regularly takes 2-3 Xanax daily.  She has not taken any Xanax today.  She states that similarly when she had this prior episode she had not taken Xanax that day.  She denies any other illicit drug use or alcohol use.  No collateral information regarding event as she is the only historian. Past Medical History:  Diagnosis Date  . Arthritis   . Tachycardia     There are no active problems to display for this patient.   Past Surgical History:  Procedure Laterality Date  . KNEE SURGERY       OB History    Gravida  0   Para      Term      Preterm      AB      Living        SAB      TAB      Ectopic      Multiple      Live Births               Home Medications    Prior to Admission medications   Medication Sig Start Date End Date Taking? Authorizing Provider  acetaminophen (TYLENOL) 500 MG tablet Take 1,000 mg by mouth every 6 (six) hours as needed for mild pain.   Yes [provider]  ALPRAZolam Prudy Feeler) 1 MG tablet Take 2 mg by mouth daily as needed for anxiety.   Yes [provider]  ibuprofen (ADVIL,MOTRIN) 600 MG tablet Take 1 tablet (600 mg total) by mouth every 6 (six) hours as needed. Patient not taking: Reported on 01/27/2018 04/18/17   Antony Madura, PA-C  levofloxacin (LEVAQUIN) 500 MG tablet Take 1 tablet (500 mg total) by mouth daily. Patient not taking: Reported on 01/27/2018 04/18/17   Antony Madura, PA-C  metroNIDAZOLE (FLAGYL) 500 MG tablet Take 1 tablet (500 mg total) by mouth 2 (two) times daily. Patient not taking: Reported on 01/27/2018 04/18/17   Antony Madura, PA-C    Family History History reviewed. No pertinent family history.  Social History Social History   Tobacco Use  . Smoking status: Current Every Day Smoker    Packs/day: 0.50  . Smokeless tobacco: Never Used  Substance Use Topics  . Alcohol use: No  . Drug use: Yes    Types: Marijuana    Comment: xanax 2.5 bars a day     Allergies   Ceclor [cefaclor] and Coconut oil   Review of Systems Review of Systems  Constitutional: Negative for fever.  Respiratory: Negative for shortness of breath.   Cardiovascular: Negative for chest pain.  Gastrointestinal: Negative for abdominal pain, nausea and vomiting.  Genitourinary: Negative for dysuria.  Neurological: Positive for seizures and headaches. Negative for weakness.  All other systems reviewed and are negative.    Physical Exam Updated Vital Signs BP 101/66   Pulse 74   Temp 98.3 F (36.8 C) (Oral)   Resp (!) 24   Ht 1.626 m (5\' 4" )   Wt 63.5 kg   LMP 01/25/2018 (Exact Date)   SpO2 97%   BMI 24.03 kg/m   Physical Exam  Constitutional: She is oriented to person, place, and time. She appears well-developed and well-nourished.  HENT:  Head: Normocephalic and atraumatic.  Eyes: Pupils are equal, round, and reactive to light. EOM are normal.  Neck: Neck supple.  Cardiovascular: Normal rate, regular rhythm and normal heart sounds.  Pulmonary/Chest: Effort normal and breath sounds normal. No respiratory distress.  She has no wheezes.  Abdominal: Soft. Bowel sounds are normal. There is no tenderness.  Neurological: She is alert and oriented to person, place, and time.  Hernial nerves II through XII intact, 5 out of 5 strength in all 4 extremities, no dysmetria to finger-nose-finger  Skin: Skin is warm and dry.  Psychiatric: She has a normal mood and affect.  Nursing note and vitals reviewed.    ED Treatments / Results  Labs (all labs ordered are listed, but only abnormal results are displayed) Labs Reviewed  CBC - Abnormal; Notable for the following components:      Result Value   WBC 11.3 (*)    All other components within normal limits  BASIC METABOLIC PANEL - Abnormal; Notable for the following components:   CO2 18 (*)    Glucose, Bld 143 (*)    All other components within normal limits  CBG MONITORING, ED - Abnormal; Notable for the following components:   Glucose-Capillary 139 (*)    All other components within normal limits  I-STAT BETA HCG BLOOD, ED ([facility], [facility], [facility] ONLY)    EKG EKG Interpretation  Date/Time:  Tuesday January 26 2018 22:34:56 EDT Ventricular Rate:  81 PR Interval:    QRS Duration: 80 QT Interval:  349 QTC Calculation: 406 R Axis:   75 Text Interpretation:  Sinus rhythm Confirmed by Ross Marcus ([id number]) on 01/26/2018 11:46:34 PM   Radiology No results found.  Procedures Procedures (including critical care time)  Medications Ordered in ED Medications  LORazepam (ATIVAN) tablet 1 mg (1 mg Oral Given 01/27/18 0039)     Initial Impression / Assessment and Plan / ED Course  I have reviewed the triage vital signs and the nursing notes.  Pertinent labs & imaging results that were available during my care of the patient were reviewed by me and considered in my medical decision making (see chart for details).    Patient presents with episodes suspicious for possible seizure.  She is overall nontoxic on exam and is at her baseline.  Vital signs are  reassuring.  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  Question benzodiazepine withdrawal seizures.  Her neurologic exam is normal.  No evidence of metabolic derangement.  EKG without evidence of arrhythmia.  Patient was given 1 dose of Ativan.  I discussed at length with patient my concerns that she may have benzodiazepine withdrawal seizures.  The only safe way to taper her off Xanax would be a hospital admission.  Patient does not  want to be admitted to the hospital.  She does express that she would like to come off her Xanax.  I discussed with her that she should not do this without proper medical supervision.  She stated understanding.  Patient was provided with outpatient resources.  She was also given neurology follow-up and advised not to drive or operate heavy machinery until cleared by neurology.  After history, exam, and medical workup I feel the patient has been appropriately medically screened and is safe for discharge home. Pertinent diagnoses were discussed with the patient. Patient was given return precautions.   Final Clinical Impressions(s) / ED Diagnoses   Final diagnoses:  Seizure (HCC)  Benzodiazepine withdrawal with complication [facility])    ED Discharge Orders    None       Wilkie Aye, Mayer Masker, MD 01/27/18 [phone]

## 2018-01-27 MED ORDER — LORAZEPAM 1 MG PO TABS
1.0000 mg | ORAL_TABLET | Freq: Once | ORAL | Status: AC
  Administered 2018-01-27: 1 mg via ORAL
  Filled 2018-01-27: qty 1

## 2018-01-27 NOTE — Discharge Instructions (Signed)
You were seen today for episode suspicious of seizure.  Given your ongoing Xanax abuse and the fact that you have not taken any Xanax today, your symptoms may be related to withdrawal.  Xanax withdrawal can be life-threatening.  If you choose to stop taking Xanax, you need to taper the dose and do so under medical supervision.  You are at risk for seizures.  Follow-up with neurology.  You are also given outpatient substance abuse resources.  You should not drive or operate heavy machinery until cleared by neurology.

## 2018-01-27 NOTE — ED Notes (Signed)
PT states understanding of care given, follow up care. PT ambulated from ED to car with a steady gait.  

## 2018-07-07 ENCOUNTER — Encounter (HOSPITAL_COMMUNITY): Payer: Self-pay | Admitting: *Deleted

## 2018-07-07 ENCOUNTER — Inpatient Hospital Stay (HOSPITAL_COMMUNITY)
Admission: AD | Admit: 2018-07-07 | Discharge: 2018-07-07 | Disposition: A | Discharge status: Home / Self Care | Payer: 59 | Attending: Obstetrics & Gynecology | Admitting: Obstetrics & Gynecology

## 2018-07-07 DIAGNOSIS — Z3202 Encounter for pregnancy test, result negative: Secondary | ICD-10-CM | POA: Diagnosis not present

## 2018-07-07 DIAGNOSIS — R102 Pelvic and perineal pain: Secondary | ICD-10-CM | POA: Diagnosis present

## 2018-07-07 DIAGNOSIS — N760 Acute vaginitis: Secondary | ICD-10-CM | POA: Diagnosis not present

## 2018-07-07 DIAGNOSIS — B9689 Other specified bacterial agents as the cause of diseases classified elsewhere: Secondary | ICD-10-CM | POA: Insufficient documentation

## 2018-07-07 DIAGNOSIS — F17210 Nicotine dependence, cigarettes, uncomplicated: Secondary | ICD-10-CM | POA: Diagnosis not present

## 2018-07-07 LAB — WET PREP, GENITAL
Sperm: NONE SEEN
Trich, Wet Prep: NONE SEEN
Yeast Wet Prep HPF POC: NONE SEEN

## 2018-07-07 LAB — URINALYSIS, ROUTINE W REFLEX MICROSCOPIC
Bilirubin Urine: NEGATIVE
GLUCOSE, UA: NEGATIVE mg/dL
HGB URINE DIPSTICK: NEGATIVE
Ketones, ur: NEGATIVE mg/dL
LEUKOCYTE UA: NEGATIVE
Nitrite: NEGATIVE
PH: 7 (ref 5.0–8.0)
Protein, ur: NEGATIVE mg/dL
Specific Gravity, Urine: 1.01 (ref 1.005–1.030)

## 2018-07-07 LAB — POCT PREGNANCY, URINE: PREG TEST UR: NEGATIVE

## 2018-07-07 LAB — HCG, QUANTITATIVE, PREGNANCY: hCG, Beta Chain, Quant, S: 1 m[IU]/mL (ref <5)

## 2018-07-07 MED ORDER — METRONIDAZOLE 500 MG PO TABS: 500.0000 mg | ORAL_TABLET | Freq: Two times a day (BID) | ORAL | 0 refills | Status: DC

## 2018-07-07 NOTE — MAU Note (Signed)
Positive home preg test last week, having pelvic pain for a few days. Denies bleeding.

## 2018-07-07 NOTE — Discharge Instructions (Signed)
Bacterial Vaginosis  Bacterial vaginosis is a vaginal infection that occurs when the normal balance of bacteria in the vagina is disrupted. It results from an overgrowth of certain bacteria. This is the most common vaginal infection among women ages 71-44. Because bacterial vaginosis increases your risk for STIs (sexually transmitted infections), getting treated can help reduce your risk for chlamydia, gonorrhea, herpes, and HIV (human immunodeficiency virus). Treatment is also important for preventing complications in pregnant women, because this condition can cause an early (premature) delivery. What are the causes? This condition is caused by an increase in harmful bacteria that are normally present in small amounts in the vagina. However, the reason that the condition develops is not fully understood. What increases the risk? The following factors may make you more likely to develop this condition:  Having a new sexual partner or multiple sexual partners.  Having unprotected sex.  Douching.  Having an intrauterine device (IUD).  Smoking.  Drug and alcohol abuse.  Taking certain antibiotic medicines.  Being pregnant. You cannot get bacterial vaginosis from toilet seats, bedding, swimming pools, or contact with objects around you. What are the signs or symptoms? Symptoms of this condition include:  Grey or white vaginal discharge. The discharge can also be watery or foamy.  A fish-like odor with discharge, especially after sexual intercourse or during menstruation.  Itching in and around the vagina.  Burning or pain with urination. Some women with bacterial vaginosis have no signs or symptoms. How is this diagnosed? This condition is diagnosed based on:  Your medical history.  A physical exam of the vagina.  Testing a sample of vaginal fluid under a microscope to look for a large amount of bad bacteria or abnormal cells. Your health care provider may use a cotton swab or  a small wooden spatula to collect the sample. How is this treated? This condition is treated with antibiotics. These may be given as a pill, a vaginal cream, or a medicine that is put into the vagina (suppository). If the condition comes back after treatment, a second round of antibiotics may be needed. Follow these instructions at home: Medicines  Take over-the-counter and prescription medicines only as told by your health care provider.  Take or use your antibiotic as told by your health care provider. Do not stop taking or using the antibiotic even if you start to feel better. General instructions  If you have a female sexual partner, tell her that you have a vaginal infection. She should see her health care provider and be treated if she has symptoms. If you have a female sexual partner, he does not need treatment.  During treatment: ? Avoid sexual activity until you finish treatment. ? Do not douche. ? Avoid alcohol as directed by your health care provider. ? Avoid breastfeeding as directed by your health care provider.  Drink enough water and fluids to keep your urine clear or pale yellow.  Keep the area around your vagina and rectum clean. ? Wash the area daily with warm water. ? Wipe yourself from front to back after using the toilet.  Keep all follow-up visits as told by your health care provider. This is important. How is this prevented?  Do not douche.  Wash the outside of your vagina with warm water only.  Use protection when having sex. This includes latex condoms and dental dams.  Limit how many sexual partners you have. To help prevent bacterial vaginosis, it is best to have sex with just one partner (  monogamous).  Make sure you and your sexual partner are tested for STIs.  Wear cotton or cotton-lined underwear.  Avoid wearing tight pants and pantyhose, especially during summer.  Limit the amount of alcohol that you drink.  Do not use any products that contain  nicotine or tobacco, such as cigarettes and e-cigarettes. If you need help quitting, ask your health care provider.  Do not use illegal drugs. Where to find more information  Centers for Disease Control and Prevention: SolutionApps.co.za  American Sexual Health Association (ASHA): www.ashastd.org  U.S. Department of Health and Health and safety inspector, Office on Women's Health: ConventionalMedicines.si or http://www.anderson-williamson.info/ Contact a health care provider if:  Your symptoms do not improve, even after treatment.  You have more discharge or pain when urinating.  You have a fever.  You have pain in your abdomen.  You have pain during sex.  You have vaginal bleeding between periods. Summary  Bacterial vaginosis is a vaginal infection that occurs when the normal balance of bacteria in the vagina is disrupted.  Because bacterial vaginosis increases your risk for STIs (sexually transmitted infections), getting treated can help reduce your risk for chlamydia, gonorrhea, herpes, and HIV (human immunodeficiency virus). Treatment is also important for preventing complications in pregnant women, because the condition can cause an early (premature) delivery.  This condition is treated with antibiotic medicines. These may be given as a pill, a vaginal cream, or a medicine that is put into the vagina (suppository). This information is not intended to replace advice given to you by your health care provider. Make sure you discuss any questions you have with your health care provider. Document Released: 05/12/2005 Document Revised: 09/15/2016 Document Reviewed: 01/26/2016 Elsevier Interactive Patient Education  2019 [employer].  Contraception Choices Contraception, also called birth control, refers to methods or devices that prevent pregnancy. Hormonal methods Contraceptive implant  A contraceptive implant is a thin, plastic tube that contains a hormone. It is  inserted into the upper part of the arm. It can remain in place for up to 3 years. Progestin-only injections Progestin-only injections are injections of progestin, a synthetic form of the hormone progesterone. They are given every 3 months by a health care provider. Birth control pills  Birth control pills are pills that contain hormones that prevent pregnancy. They must be taken once a day, preferably at the same time each day. Birth control patch  The birth control patch contains hormones that prevent pregnancy. It is placed on the skin and must be changed once a week for three weeks and removed on the fourth week. A prescription is needed to use this method of contraception. Vaginal ring  A vaginal ring contains hormones that prevent pregnancy. It is placed in the vagina for three weeks and removed on the fourth week. After that, the process is repeated with a new ring. A prescription is needed to use this method of contraception. Emergency contraceptive Emergency contraceptives prevent pregnancy after unprotected sex. They come in pill form and can be taken up to 5 days after sex. They work best the sooner they are taken after having sex. Most emergency contraceptives are available without a prescription. This method should not be used as your only form of birth control. Barrier methods Female condom  A female condom is a thin sheath that is worn over the penis during sex. Condoms keep sperm from going inside a woman's body. They can be used with a spermicide to increase their effectiveness. They should be disposed after a single  use. Female condom  A female condom is a soft, loose-fitting sheath that is put into the vagina before sex. The condom keeps sperm from going inside a woman's body. They should be disposed after a single use. Diaphragm  A diaphragm is a soft, dome-shaped barrier. It is inserted into the vagina before sex, along with a spermicide. The diaphragm blocks sperm from  entering the uterus, and the spermicide kills sperm. A diaphragm should be left in the vagina for 6-8 hours after sex and removed within 24 hours. A diaphragm is prescribed and fitted by a health care provider. A diaphragm should be replaced every 1-2 years, after giving birth, after gaining more than 15 lb (6.8 kg), and after pelvic surgery. Cervical cap  A cervical cap is a round, soft latex or plastic cup that fits over the cervix. It is inserted into the vagina before sex, along with spermicide. It blocks sperm from entering the uterus. The cap should be left in place for 6-8 hours after sex and removed within 48 hours. A cervical cap must be prescribed and fitted by a health care provider. It should be replaced every 2 years. Sponge  A sponge is a soft, circular piece of polyurethane foam with spermicide on it. The sponge helps block sperm from entering the uterus, and the spermicide kills sperm. To use it, you make it wet and then insert it into the vagina. It should be inserted before sex, left in for at least 6 hours after sex, and removed and thrown away within 30 hours. Spermicides Spermicides are chemicals that kill or block sperm from entering the cervix and uterus. They can come as a cream, jelly, suppository, foam, or tablet. A spermicide should be inserted into the vagina with an applicator at least 10-15 minutes before sex to allow time for it to work. The process must be repeated every time you have sex. Spermicides do not require a prescription. Intrauterine contraception Intrauterine device (IUD) An IUD is a T-shaped device that is put in a woman's uterus. There are two types:  Hormone IUD.This type contains progestin, a synthetic form of the hormone progesterone. This type can stay in place for 3-5 years.  Copper IUD.This type is wrapped in copper wire. It can stay in place for 10 years.  Permanent methods of contraception Female tubal ligation In this method, a woman's  fallopian tubes are sealed, tied, or blocked during surgery to prevent eggs from traveling to the uterus. Hysteroscopic sterilization In this method, a small, flexible insert is placed into each fallopian tube. The inserts cause scar tissue to form in the fallopian tubes and block them, so sperm cannot reach an egg. The procedure takes about 3 months to be effective. Another form of birth control must be used during those 3 months. Female sterilization This is a procedure to tie off the tubes that carry sperm (vasectomy). After the procedure, the man can still ejaculate fluid (semen). Natural planning methods Natural family planning In this method, a couple does not have sex on days when the woman could become pregnant. Calendar method This means keeping track of the length of each menstrual cycle, identifying the days when pregnancy can happen, and not having sex on those days. Ovulation method In this method, a couple avoids sex during ovulation. Symptothermal method This method involves not having sex during ovulation. The woman typically checks for ovulation by watching changes in her temperature and in the consistency of cervical mucus. Post-ovulation method In this method,  a couple waits to have sex until after ovulation. Summary  Contraception, also called birth control, means methods or devices that prevent pregnancy.  Hormonal methods of contraception include implants, injections, pills, patches, vaginal rings, and emergency contraceptives.  Barrier methods of contraception can include female condoms, female condoms, diaphragms, cervical caps, sponges, and spermicides.  There are two types of IUDs (intrauterine devices). An IUD can be put in a woman's uterus to prevent pregnancy for 3-5 years.  Permanent sterilization can be done through a procedure for males, females, or both.  Natural family planning methods involve not having sex on days when the woman could become pregnant. This  information is not intended to replace advice given to you by your health care provider. Make sure you discuss any questions you have with your health care provider. Document Released: 05/12/2005 Document Revised: 05/14/2017 Document Reviewed: 06/14/2016 Elsevier Interactive Patient Education  2019 [employer].

## 2018-07-07 NOTE — MAU Provider Note (Signed)
History     CSN: [id number]  Arrival date and time: 07/07/18 1751   First Provider Initiated Contact with Patient 07/07/18 0919      Chief Complaint  Patient presents with  . Possible Pregnancy  . Pelvic Pain   Virginia [name] is a 23 y.o. female who presents for Possible Pregnancy and Pelvic Pain.  Patient states she had a positive pregnancy test last week with the onset of cramping and pelvic pain.  She reports that she also started having vaginal discharge around the same time that is milky white and denies odor, itching, burning, or irritation.  She reports that sometimes it is "difficult to pee, but it doesn't hurt."  Patient denies issues with constipation or diarrhea.  She reports that the pain is intermittent and lasts about 2 minutes and feels like sharp cramps.  However, patient denies pain currently and recalls that her last incident of pain was last night.    Pertinent Gynecological History: Menses: flow is moderate Bleeding: None since November Contraception: none DES exposure: unknown Blood transfusions: none Sexually transmitted diseases: currently at risk Previous GYN Procedures: None  Last mammogram: N/A Date: N/A Last pap: None   Past Medical History:  Diagnosis Date  . Arthritis   . Tachycardia     Past Surgical History:  Procedure Laterality Date  . KNEE SURGERY      History reviewed. No pertinent family history.  Social History   Tobacco Use  . Smoking status: Current Every Day Smoker    Packs/day: 0.50  . Smokeless tobacco: Never Used  Substance Use Topics  . Alcohol use: No  . Drug use: Yes    Types: Marijuana    Comment: xanax 2.5 bars a day    Allergies:  Allergies  Allergen Reactions  . Ceclor [Cefaclor] Hives  . Coconut Oil Hives    hives    Medications Prior to Admission  Medication Sig Dispense Refill Last Dose  . acetaminophen (TYLENOL) 500 MG tablet Take 1,000 mg by mouth every 6 (six) hours as needed for mild pain.    unk  . ALPRAZolam (XANAX) 1 MG tablet Take 2 mg by mouth daily as needed for anxiety.   01/25/2018  . ibuprofen (ADVIL,MOTRIN) 600 MG tablet Take 1 tablet (600 mg total) by mouth every 6 (six) hours as needed. (Patient not taking: Reported on 01/27/2018) 30 tablet 0 Not Taking at Unknown time  . levofloxacin (LEVAQUIN) 500 MG tablet Take 1 tablet (500 mg total) by mouth daily. (Patient not taking: Reported on 01/27/2018) 14 tablet 0 Not Taking at Unknown time  . metroNIDAZOLE (FLAGYL) 500 MG tablet Take 1 tablet (500 mg total) by mouth 2 (two) times daily. (Patient not taking: Reported on 01/27/2018) 28 tablet 0 Not Taking at Unknown time    Review of Systems  Constitutional: Negative for chills and fever.  Gastrointestinal: Positive for nausea and vomiting. Negative for abdominal pain, constipation and diarrhea.  Genitourinary: Positive for dyspareunia, pelvic pain and vaginal discharge. Negative for dysuria and vaginal bleeding.  Neurological: Negative for dizziness, light-headedness and headaches.   Physical Exam   Blood pressure 109/69, pulse 80, temperature 98.1 F (36.7 C), temperature source Oral, resp. rate 16, height 5\' 4"  (1.626 m), weight 62.1 kg, last menstrual period 04/19/2018, SpO2 98 %.  Physical Exam  Constitutional: She is oriented to person, place, and time. She appears well-developed and well-nourished. No distress.  HENT:  Head: Normocephalic and atraumatic.  Eyes: Conjunctivae are normal.  Neck:  Normal range of motion.  Cardiovascular: Normal rate.  Respiratory: Effort normal.  GI: Soft.  Genitourinary:    Uterus normal.  Cervix exhibits no motion tenderness, no discharge and no friability. Right adnexum displays no tenderness. Left adnexum displays no tenderness.    Vaginal discharge present.     Genitourinary Comments: Speculum Exam: -Vaginal Vault: Pink mucosa.  Moderate amt milky white discharge -wet prep collected -Cervix:Pink, no lesions, cysts, or polyps.  Appears  closed. No active bleeding or discharge from os-GC/CT collected -Bimanual Exam: No tenderness in cul de sac Uterus Anteverted, NT   Musculoskeletal: Normal range of motion.        General: No edema.  Neurological: She is alert and oriented to person, place, and time.  Skin: Skin is warm and dry.  Psychiatric: She has a normal mood and affect. Her behavior is normal.    MAU Course  Procedures Results for orders placed or performed during the hospital encounter of 07/07/18 (from the past 24 hour(s))  Urinalysis, Routine w reflex microscopic     Status: None   Collection Time: 07/07/18  8:59 AM  Result Value Ref Range   Color, Urine YELLOW YELLOW   APPearance CLEAR CLEAR   Specific Gravity, Urine 1.010 1.005 - 1.030   pH 7.0 5.0 - 8.0   Glucose, UA NEGATIVE NEGATIVE mg/dL   Hgb urine dipstick NEGATIVE NEGATIVE   Bilirubin Urine NEGATIVE NEGATIVE   Ketones, ur NEGATIVE NEGATIVE mg/dL   Protein, ur NEGATIVE NEGATIVE mg/dL   Nitrite NEGATIVE NEGATIVE   Leukocytes,Ua NEGATIVE NEGATIVE  Pregnancy, urine POC     Status: None   Collection Time: 07/07/18  9:02 AM  Result Value Ref Range   Preg Test, Ur NEGATIVE NEGATIVE  Wet prep, genital     Status: Abnormal   Collection Time: 07/07/18  9:32 AM  Result Value Ref Range   Yeast Wet Prep HPF POC NONE SEEN NONE SEEN   Trich, Wet Prep NONE SEEN NONE SEEN   Clue Cells Wet Prep HPF POC PRESENT (A) NONE SEEN   WBC, Wet Prep HPF POC FEW (A) NONE SEEN   Sperm NONE SEEN   hCG, quantitative, pregnancy     Status: None   Collection Time: 07/07/18  9:34 AM  Result Value Ref Range   hCG, Beta Chain, Quant, S 1 <5 mIU/mL    MDM Pelvic Exam with cultures; Wet prep and GC/CT Labs: UA, hCG  Assessment and Plan  Vaginal Discharge +Home UPT -UPT   -Exam findings discussed -Wet Prep pending -GC/CT collected and sent -hCG to be collected   Follow Up (10:44 AM) Bacterial Vaginosis HCG <5  -Wet prep returns significant for clue  cells -Results discussed with patient -Rx for Metronidazole 500mg  PO sent  -Educated on medication usage including avoidance of alcohol while using and at least 48 hours after last dose.  -Discussed ways to prevent bacterial vaginosis:  +Wear cotton underwear +Use low scent or scent free soaps and detergents +No douching +condom usage -Informed of hCG level of 1 which is c/w negative pregnancy. -Patient denies desire for current pregnancy. -encouraged to obtain primary gyn for initiation and mgmt of BCM. -Requests and given list of providers -Encouraged to call or return to MAU if symptoms worsen or with the onset of new symptoms. -Discharged to home in stable condition  Cherre Robins MSN, CNM 07/07/2018, 9:22 AM

## 2018-07-08 LAB — GC/CHLAMYDIA PROBE AMP (~~LOC~~) NOT AT ARMC
Chlamydia: NEGATIVE
Neisseria Gonorrhea: NEGATIVE

## 2019-03-11 ENCOUNTER — Other Ambulatory Visit: Payer: Self-pay

## 2019-03-11 DIAGNOSIS — Z20822 Contact with and (suspected) exposure to covid-19: Secondary | ICD-10-CM

## 2019-03-13 LAB — NOVEL CORONAVIRUS, NAA: SARS-CoV-2, NAA: NOT DETECTED

## 2019-10-18 ENCOUNTER — Other Ambulatory Visit: Payer: Self-pay

## 2019-10-18 ENCOUNTER — Emergency Department (HOSPITAL_COMMUNITY)
Admission: EM | Admit: 2019-10-18 | Discharge: 2019-10-18 | Disposition: A | Discharge status: Home / Self Care | Payer: 59 | Attending: Emergency Medicine | Admitting: Emergency Medicine

## 2019-10-18 ENCOUNTER — Encounter (HOSPITAL_COMMUNITY): Payer: Self-pay | Admitting: Emergency Medicine

## 2019-10-18 DIAGNOSIS — Z5321 Procedure and treatment not carried out due to patient leaving prior to being seen by health care provider: Secondary | ICD-10-CM | POA: Diagnosis not present

## 2019-10-18 DIAGNOSIS — R197 Diarrhea, unspecified: Secondary | ICD-10-CM | POA: Insufficient documentation

## 2019-10-18 DIAGNOSIS — R112 Nausea with vomiting, unspecified: Secondary | ICD-10-CM | POA: Insufficient documentation

## 2019-10-18 DIAGNOSIS — R109 Unspecified abdominal pain: Secondary | ICD-10-CM | POA: Insufficient documentation

## 2019-10-18 MED ORDER — SODIUM CHLORIDE 0.9% FLUSH: 3.0000 mL | Freq: Once | INTRAVENOUS | Status: DC

## 2019-10-18 NOTE — ED Triage Notes (Signed)
Patient here from home reporting abd pain, n/v, diarrhea that started this morning.

## 2019-10-18 NOTE — ED Notes (Signed)
I called patient kname in the lobby and outside to take back to a room and no one responded

## 2019-10-18 NOTE — ED Provider Notes (Signed)
Signed up for patient as they were shown to be placed in a room and "waiting on provider." Patient's name was then removed from system by staff as they state they called patient's name in lobby with no response.   I did not have contact with this patient.   Concepcion Living 10/18/19 1011    Lorre Nick, MD 10/19/19 1259

## 2020-06-20 ENCOUNTER — Other Ambulatory Visit: Payer: Self-pay

## 2020-06-20 ENCOUNTER — Inpatient Hospital Stay (HOSPITAL_COMMUNITY)
Admission: AD | Admit: 2020-06-20 | Discharge: 2020-06-20 | Disposition: A | Discharge status: Home / Self Care | Payer: 59 | Attending: Obstetrics & Gynecology | Admitting: Obstetrics & Gynecology

## 2020-06-20 DIAGNOSIS — Z3202 Encounter for pregnancy test, result negative: Secondary | ICD-10-CM | POA: Diagnosis not present

## 2020-06-20 DIAGNOSIS — R109 Unspecified abdominal pain: Secondary | ICD-10-CM | POA: Insufficient documentation

## 2020-06-20 DIAGNOSIS — N898 Other specified noninflammatory disorders of vagina: Secondary | ICD-10-CM | POA: Diagnosis present

## 2020-06-20 DIAGNOSIS — F17210 Nicotine dependence, cigarettes, uncomplicated: Secondary | ICD-10-CM | POA: Insufficient documentation

## 2020-06-20 DIAGNOSIS — Z881 Allergy status to other antibiotic agents status: Secondary | ICD-10-CM | POA: Diagnosis not present

## 2020-06-20 LAB — POCT PREGNANCY, URINE: Preg Test, Ur: NEGATIVE

## 2020-06-20 LAB — HCG, QUANTITATIVE, PREGNANCY: hCG, Beta Chain, Quant, S: 1 m[IU]/mL (ref <5)

## 2020-06-20 NOTE — Discharge Instructions (Signed)
[facility] Providers    [facility]       Phone: [phone]  [facility]   Phone: [phone]  [facility]  Phone: [phone]  [facility]  Phone: [phone]  [facility]  Phone: [phone]  [facility]   Phone: [phone]  [facility]       Phone: [phone]  [facility]    Phone: [phone]   [facility]    Phone: [phone]  [facility]    Phone: [phone]  [facility]    Phone: [phone]  [facility]       Phone: [phone]   [facility]  Phone: [phone]  [facility]    Phone: [phone]  [facility]   Phone: [phone]  [facility]      Phone: [phone]  [facility]    Phone: [phone]   

## 2020-06-20 NOTE — MAU Provider Note (Signed)
Chief Complaint: Abdominal Pain and Vaginal Discharge   Event Date/Time   First Provider Initiated Contact with Patient 06/20/20 1730      SUBJECTIVE HPI: Virginia [name] is a 25 y.o. G1P0010 who presents to maternity admissions for confirmation of pregnancy.   LMP was week before Thanksgiving, missed period in Dec and Jan. Hx of irregular menstrual cycles, regular cycles for 1 year. Reports positive pregnancy test 2 weeks ago. No pain, some watery discharge.   Past Medical History:  Diagnosis Date  . Arthritis   . Tachycardia    Past Surgical History:  Procedure Laterality Date  . KNEE SURGERY     Social History   Socioeconomic History  . Marital status: Single    Spouse name: Not on file  . Number of children: Not on file  . Years of education: Not on file  . Highest education level: Not on file  Occupational History  . Not on file  Tobacco Use  . Smoking status: Current Every Day Smoker    Packs/day: 0.50  . Smokeless tobacco: Never Used  Substance and Sexual Activity  . Alcohol use: No  . Drug use: Yes    Types: Marijuana    Comment: xanax 2.5 bars a day  . Sexual activity: Yes  Other Topics Concern  . Not on file  Social History Narrative  . Not on file   Social Determinants of Health   Financial Resource Strain: Not on file  Food Insecurity: Not on file  Transportation Needs: Not on file  Physical Activity: Not on file  Stress: Not on file  Social Connections: Not on file  Intimate Partner Violence: Not on file   No current facility-administered medications on file prior to encounter.   Current Outpatient Medications on File Prior to Encounter  Medication Sig Dispense Refill  . acetaminophen (TYLENOL) 500 MG tablet Take 1,000 mg by mouth every 6 (six) hours as needed for mild pain.    Marland Kitchen ALPRAZolam (XANAX) 1 MG tablet Take 2 mg by mouth daily as needed for anxiety.    . metroNIDAZOLE (FLAGYL) 500 MG tablet Take 1 tablet (500 mg total) by mouth 2  (two) times daily. 14 tablet 0   Allergies  Allergen Reactions  . Ceclor [Cefaclor] Hives  . Coconut Oil Hives    hives    ROS:  Review of Systems  Gastrointestinal: Negative for abdominal pain.  Genitourinary: Positive for vaginal discharge.    I have reviewed patient's Past Medical Hx, Surgical Hx, Family Hx, Social Hx, medications and allergies.   Physical Exam   Patient Vitals for the past 24 hrs:  BP Temp Temp src Pulse Resp Weight  06/20/20 1723 111/68 98.3 F (36.8 C) Oral 96 19 72.1 kg   Physical Exam Constitutional:      General: She is not in acute distress.    Appearance: She is well-developed. She is not ill-appearing, toxic-appearing or diaphoretic.  HENT:     Head: Normocephalic.  Neurological:     Mental Status: She is alert and oriented to person, place, and time.    Results for orders placed or performed during the hospital encounter of 06/20/20 (from the past 48 hour(s))  Pregnancy, urine POC     Status: None   Collection Time: 06/20/20  5:22 PM  Result Value Ref Range   Preg Test, Ur NEGATIVE NEGATIVE    Comment:        THE SENSITIVITY OF THIS METHODOLOGY IS >24 mIU/mL  hCG, quantitative, pregnancy     Status: None   Collection Time: 06/20/20  5:36 PM  Result Value Ref Range   hCG, Beta Chain, Quant, S 1 <5 mIU/mL    Comment:          GEST. AGE      CONC.  (mIU/mL)   <=1 WEEK        5 - 50     2 WEEKS       50 - 500     3 WEEKS       100 - 10,000     4 WEEKS     1,000 - 30,000     5 WEEKS     3,500 - 115,000   6-8 WEEKS     12,000 - 270,000    12 WEEKS     15,000 - 220,000        FEMALE AND NON-PREGNANT FEMALE:     LESS THAN 5 mIU/mL Performed at [facility], [address], [place], [place] [address]    MDM Patient denies any concerning symptoms in need of emergent evaluation.  Urine pregnancy test negative here Quant pending. Attempted to call patient @ 1930 to discuss negative Quant. No answer, will attempt to call her  back.    ASSESSMENT MSE Complete  PLAN Discharge patient at her request to seek non-emergent medical care elsewhere or office follow up. Will call patient with hcg results.   Duane Lope, NP 06/20/2020 5:32 PM

## 2020-06-20 NOTE — MAU Note (Signed)
Patient had 1 + HPT at home 2 weeks ago.  Ran out of her prebiotic/probiotics at home and since then has been having abdominal pain & watery discharge.  LMP the week before thanksgiving.  Denies VB.

## 2021-05-30 ENCOUNTER — Emergency Department (HOSPITAL_COMMUNITY)
Admission: EM | Admit: 2021-05-30 | Discharge: 2021-05-30 | Disposition: A | Discharge status: Home / Self Care | Payer: No Typology Code available for payment source | Attending: Emergency Medicine | Admitting: Emergency Medicine

## 2021-05-30 ENCOUNTER — Other Ambulatory Visit: Payer: Self-pay

## 2021-05-30 ENCOUNTER — Emergency Department (HOSPITAL_COMMUNITY): Payer: No Typology Code available for payment source

## 2021-05-30 ENCOUNTER — Encounter (HOSPITAL_COMMUNITY): Payer: Self-pay

## 2021-05-30 DIAGNOSIS — Z20822 Contact with and (suspected) exposure to covid-19: Secondary | ICD-10-CM | POA: Insufficient documentation

## 2021-05-30 DIAGNOSIS — J069 Acute upper respiratory infection, unspecified: Secondary | ICD-10-CM | POA: Insufficient documentation

## 2021-05-30 DIAGNOSIS — R059 Cough, unspecified: Secondary | ICD-10-CM | POA: Diagnosis present

## 2021-05-30 LAB — RESP PANEL BY RT-PCR (FLU A&B, COVID) ARPGX2
Influenza A by PCR: NEGATIVE
Influenza B by PCR: NEGATIVE
SARS Coronavirus 2 by RT PCR: NEGATIVE

## 2021-05-30 MED ORDER — METHYLPREDNISOLONE 4 MG PO TBPK: ORAL_TABLET | ORAL | 0 refills | Status: AC

## 2021-05-30 MED ORDER — BENZONATATE 100 MG PO CAPS: 100.0000 mg | ORAL_CAPSULE | Freq: Three times a day (TID) | ORAL | 0 refills | Status: AC

## 2021-05-30 NOTE — Discharge Instructions (Signed)
Your work-up in the ER today was reassuring for acute findings.  Please take medications as prescribed for your cough.  Follow-up with your PCP if you do not have resolution of your symptoms.  Return if development of any new or worsening symptoms

## 2021-05-30 NOTE — ED Notes (Signed)
I provided reinforced discharge education based off of discharge instructions. Pt acknowledged and understood my education. Pt had no further questions/concerns for provider/myself.  °

## 2021-05-30 NOTE — ED Triage Notes (Signed)
Pt reports she is coughing up yellow/green/black mucus and earlier today coughed up blood tinged mucus. Denies any other symptom or complaints - ongoing for a month and a half unrelief with OTC meds.
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
## 2021-06-26 ENCOUNTER — Other Ambulatory Visit: Payer: Self-pay

## 2021-06-26 ENCOUNTER — Encounter: Payer: Self-pay | Admitting: *Deleted

## 2021-06-26 ENCOUNTER — Emergency Department
Admission: EM | Admit: 2021-06-26 | Discharge: 2021-06-26 | Disposition: A | Discharge status: Home / Self Care | Payer: No Typology Code available for payment source | Attending: Emergency Medicine | Admitting: Emergency Medicine

## 2021-06-26 DIAGNOSIS — K0889 Other specified disorders of teeth and supporting structures: Secondary | ICD-10-CM | POA: Diagnosis not present

## 2021-06-26 DIAGNOSIS — R000 Tachycardia, unspecified: Secondary | ICD-10-CM | POA: Diagnosis not present

## 2021-06-26 DIAGNOSIS — R220 Localized swelling, mass and lump, head: Secondary | ICD-10-CM | POA: Diagnosis present

## 2021-06-26 MED ORDER — AMOXICILLIN 875 MG PO TABS: 875.0000 mg | ORAL_TABLET | Freq: Two times a day (BID) | ORAL | 0 refills | Status: AC

## 2021-06-26 MED ORDER — AMOXICILLIN-POT CLAVULANATE 875-125 MG PO TABS
1.0000 | ORAL_TABLET | Freq: Once | ORAL | Status: AC
  Administered 2021-06-26: 1 via ORAL
  Filled 2021-06-26: qty 1

## 2021-06-26 NOTE — ED Triage Notes (Signed)
Pt has left upper toothache.  Swellingto left side of face.  Pt taking doxy and tylenol without relief.  Pt alert  speech clear.

## 2021-06-26 NOTE — ED Provider Notes (Signed)
[facility] Provider Note  Patient Contact: 10:29 PM (approximate)   History   Dental Pain   HPI  Virginia [name] is a 26 y.o. female presents to the emergency department with left upper jaw swelling.  Patient has a broken superior 14.  She has no pain underneath the tongue or difficulty swallowing.  No fever or chills at home.  She states that she will have an appointment with her dentist next week.      Physical Exam   Triage Vital Signs: ED Triage Vitals  Enc Vitals Group     BP 06/26/21 2146 113/76     Pulse Rate 06/26/21 2146 (!) 120     Resp 06/26/21 2146 16     Temp 06/26/21 2146 98 F (36.7 C)     Temp Source 06/26/21 2146 Oral     SpO2 06/26/21 2146 96 %     Weight 06/26/21 2145 145 lb (65.8 kg)     Height 06/26/21 2145 5\' 4"  (1.626 m)     Head Circumference --      Peak Flow --      Pain Score 06/26/21 2155 10     Pain Loc --      Pain Edu? --      Excl. in GC? --     Most recent vital signs: Vitals:   06/26/21 2146 06/26/21 2146  BP:  113/76  Pulse:  (!) 120  Resp:  16  Temp: 98 F (36.7 C)   SpO2:  96%     General: Alert and in no acute distress. Eyes:  PERRL. EOMI. Head: No acute traumatic findings ENT:      Ears: Tms are pearly.       Nose: No congestion/rhinnorhea.      Mouth/Throat: Mucous membranes are moist.  Patient has broken superior 14 with mild swelling of the left upper jaw. Neck: No stridor. No cervical spine tenderness to palpation. Cardiovascular:  Good peripheral perfusion Respiratory: Normal respiratory effort without tachypnea or retractions. Lungs CTAB. Good air entry to the bases with no decreased or absent breath sounds. Gastrointestinal: Bowel sounds 4 quadrants. Soft and nontender to palpation. No guarding or rigidity. No palpable masses. No distention. No CVA tenderness. Musculoskeletal: Full range of motion to all extremities.  Neurologic:  No gross focal neurologic deficits are appreciated.   Skin:   No rash noted Other:   ED Results / Procedures / Treatments   Labs (all labs ordered are listed, but only abnormal results are displayed) Labs Reviewed - No data to display       PROCEDURES:  Critical Care performed: No  Procedures   MEDICATIONS ORDERED IN ED: Medications  amoxicillin-clavulanate (AUGMENTIN) 875-125 MG per tablet 1 tablet (has no administration in time range)     IMPRESSION / MDM / ASSESSMENT AND PLAN / ED COURSE  I reviewed the triage vital signs and the nursing notes.                              Assessment and plan: Dental pain:  Differential diagnosis includes, but is not limited to, dental abscess, dental pain 26 year old female presents to the emergency department with swelling of the left upper jaw.  Patient was tachycardic at triage but vital signs otherwise reassuring.  Patient was alert, active and nontoxic-appearing.  Patient was started on high-dose amoxicillin twice daily for the next 10 days.  Return precautions  were given to return with new or worsening symptoms.      FINAL CLINICAL IMPRESSION(S) / ED DIAGNOSES   Final diagnoses:  Pain, dental     Rx / DC Orders   ED Discharge Orders          Ordered    amoxicillin (AMOXIL) 875 MG tablet  2 times daily        06/26/21 2225             Note:  This document was prepared using Dragon voice recognition software and may include unintentional dictation errors.   Pia Mau Sims, PA-C 06/26/21 2233    Shaune Pollack, MD 06/27/21 2236

## 2021-06-26 NOTE — Discharge Instructions (Signed)
OPTIONS FOR DENTAL FOLLOW UP CARE ° °[place] Department of Health and Human Services - Local Safety Net Dental Clinics °http://www.ncdhhs.gov/dph/oralhealth/services/safetynetclinics.htm °  °[facility] (336-562-3123) ° °[facility] (919-933-9087) ° °[facility] ([phone] ext [phone]) ° °[facility] ([phone]) ° °[facility] ([phone]) °This clinic caters to the indigent population and is on a lottery system. °Location: °[facility], [address], [place] °Clinic Hours: °Wednesdays from 6pm - 9pm, patients seen by a lottery system. °For dates, call or go to www.med.[facility].edu/[facility]/patients/Dental-[facility] °Services: °Cleanings, fillings and simple extractions. °Payment Options: °DENTAL WORK IS FREE OF CHARGE. Bring proof of income or support. °Best way to get seen: °Arrive at 5:15 pm - this is a lottery, NOT first come/first serve, so arriving earlier will not increase your chances of being seen. °  °  °[facility] °919-537-3737 °Select option 1 for emergencies °  °Location: °[facility], [address], [place] °Clinic Hours: °No walk-ins accepted - call the day before to schedule an appointment. °Check in times are 9:30 am and 1:30 pm. °Services: °Simple extractions, temporary fillings, pulpectomy/pulp debridement, uncomplicated abscess drainage. °Payment Options: °PAYMENT IS DUE AT THE TIME OF SERVICE.  Fee is usually $100-200, additional surgical procedures (e.g. abscess drainage) may be extra. °Cash, checks, Visa/MasterCard accepted.  Can file Medicaid if patient is covered for dental - patient should call case worker to check. °No discount for [facility] patients. °Best way to get seen: °MUST call the day before and get onto the schedule. Can usually be seen the next 1-2 days. No walk-ins accepted. °  °  °[facility] °[phone] °   °Location: °[facility], [address], [place] °Clinic Hours: °M, W, Th, F 8am or 1:30pm, Tues 9a or 1:30 - first come/first served. °Services: °Simple extractions, temporary fillings, uncomplicated abscess drainage.  You do not need to be an [place] resident. °Payment Options: °PAYMENT IS DUE AT THE TIME OF SERVICE. °Dental insurance, otherwise sliding scale - bring proof of income or support. °Depending on income and treatment needed, cost is usually $50-200. °Best way to get seen: °Arrive early as it is first come/first served. °  °  °[facility] °[phone] °  °Location: °[address] °Clinic Hours: °Mon-Thu 8a-5p °Services: °Most basic dental services including extractions and fillings. °Payment Options: °PAYMENT IS DUE AT THE TIME OF SERVICE. °Sliding scale, up to 50% off - bring proof if income or support. °Medicaid with dental option accepted. °Best way to get seen: °Call to schedule an appointment, can usually be seen within 2 weeks OR they will try to see walk-ins - show up at 8a or 2p (you may have to wait). °  °  °[facility] °919-245-2435 °[place] RESIDENTS ONLY °  °Location: °[facility], [address], [place], [place] [address] °Clinic Hours: By appointment only. °Monday - Thursday 8am-5pm, Friday 8am-12pm °Services: Cleanings, fillings, extractions. °Payment Options: °PAYMENT IS DUE AT THE TIME OF SERVICE. °Cash, Visa or MasterCard. Sliding scale - $30 minimum per service. °Best way to get seen: °Come in to office, complete packet and make an appointment - need proof of income °or support monies for each household member and proof of [place] residence. °Usually takes about a month to get in. °  °  °[facility] °[phone] °  °Location: °[address]   [place] °Clinic Hours: Walk-in Urgent Care Dental Services are offered Monday-Friday  mornings only. °The numbers of emergencies accepted daily is limited to the number of °providers available. °Maximum 15 - Mondays, Wednesdays & Thursdays °Maximum 10 - Tuesdays & Fridays °Services: °You do not need to be a  [place] resident to be seen for a dental emergency. °Emergencies are defined as pain, swelling, abnormal bleeding, or dental trauma. Walkins will receive x-rays if needed. °NOTE: Dental cleaning is not an emergency. °Payment Options: °PAYMENT IS DUE AT THE TIME OF SERVICE. °Minimum co-pay is $40.00 for uninsured patients. °Minimum co-pay is $3.00 for Medicaid with dental coverage. °Dental Insurance is accepted and must be presented at time of visit. °Medicare does not cover dental. °Forms of payment: Cash, credit card, checks. °Best way to get seen: °If not previously registered with the clinic, walk-in dental registration begins at 7:15 am and is on a first come/first serve basis. °If previously registered with the clinic, call to make an appointment. °  °  °[facility] °[phone] °[place] RESIDENTS ONLY °  °Location: °[address], [place], [place] °Clinic Hours: °Mon-Thu 10a-2p °Services: Extractions only! °Payment Options: °FREE (donations accepted) - bring proof of income or support °Best way to get seen: °Call and schedule an appointment OR come at 8am on the 1st Monday of every month (except for holidays) when it is first come/first served. °  °  °[facility] °[phone] °  °Location: °[address], [place] °Clinic Hours: °Friday mornings °Services, Payment Options, Best way to get seen: °Call for info °

## 2022-03-01 ENCOUNTER — Other Ambulatory Visit: Payer: Self-pay

## 2022-03-01 ENCOUNTER — Emergency Department (HOSPITAL_COMMUNITY)
Admission: EM | Admit: 2022-03-01 | Discharge: 2022-03-01 | Disposition: A | Discharge status: Home / Self Care | Payer: No Typology Code available for payment source | Attending: Emergency Medicine | Admitting: Emergency Medicine

## 2022-03-01 ENCOUNTER — Encounter (HOSPITAL_COMMUNITY): Payer: Self-pay

## 2022-03-01 DIAGNOSIS — S0501XA Injury of conjunctiva and corneal abrasion without foreign body, right eye, initial encounter: Secondary | ICD-10-CM

## 2022-03-01 DIAGNOSIS — Z23 Encounter for immunization: Secondary | ICD-10-CM | POA: Insufficient documentation

## 2022-03-01 DIAGNOSIS — X58XXXA Exposure to other specified factors, initial encounter: Secondary | ICD-10-CM | POA: Insufficient documentation

## 2022-03-01 MED ORDER — TETANUS-DIPHTH-ACELL PERTUSSIS 5-2.5-18.5 LF-MCG/0.5 IM SUSY
0.5000 mL | PREFILLED_SYRINGE | Freq: Once | INTRAMUSCULAR | Status: AC
  Administered 2022-03-01: 0.5 mL via INTRAMUSCULAR
  Filled 2022-03-01: qty 0.5

## 2022-03-01 MED ORDER — TETRACAINE HCL 0.5 % OP SOLN
2.0000 [drp] | Freq: Once | OPHTHALMIC | Status: AC
  Administered 2022-03-01: 2 [drp] via OPHTHALMIC

## 2022-03-01 MED ORDER — CIPROFLOXACIN HCL 0.3 % OP SOLN: 2.0000 [drp] | OPHTHALMIC | 0 refills | Status: AC

## 2022-03-01 MED ORDER — IBUPROFEN 200 MG PO TABS: 400.0000 mg | ORAL_TABLET | Freq: Once | ORAL | Status: DC

## 2022-03-01 MED ORDER — FLUORESCEIN SODIUM 1 MG OP STRP
1.0000 | ORAL_STRIP | Freq: Once | OPHTHALMIC | Status: AC
  Administered 2022-03-01: 1 via OPHTHALMIC
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
## 2022-04-23 ENCOUNTER — Other Ambulatory Visit: Payer: Self-pay

## 2022-04-23 ENCOUNTER — Emergency Department
Admission: EM | Admit: 2022-04-23 | Discharge: 2022-04-23 | Discharge status: Against Medical Advice | Payer: No Typology Code available for payment source | Attending: Emergency Medicine | Admitting: Emergency Medicine

## 2022-04-23 ENCOUNTER — Telehealth: Payer: Self-pay | Admitting: Emergency Medicine

## 2022-04-23 DIAGNOSIS — Z5321 Procedure and treatment not carried out due to patient leaving prior to being seen by health care provider: Secondary | ICD-10-CM | POA: Insufficient documentation

## 2022-04-23 DIAGNOSIS — H5711 Ocular pain, right eye: Secondary | ICD-10-CM | POA: Insufficient documentation

## 2022-04-23 DIAGNOSIS — H538 Other visual disturbances: Secondary | ICD-10-CM | POA: Insufficient documentation

## 2022-04-23 NOTE — ED Triage Notes (Signed)
Pt here with right eye pain. Pt states she thinks that she scratched her eye but states her vision has been blurry. Pt states she wore eyelashes on Sat and may have gotten glue in them. Pt states she has been in bed since Monday morning due to her not being able to see out of her right eye. Pt ambulatory to triage with a friend.

## 2022-04-23 NOTE — ED Notes (Signed)
Looked for patient in lobby, unable to locate.

## 2022-04-23 NOTE — Telephone Encounter (Signed)
Called patient due to left emergency department before provider exam to inquire about condition and follow up plans. Left message. 

## 2022-05-20 ENCOUNTER — Encounter (HOSPITAL_BASED_OUTPATIENT_CLINIC_OR_DEPARTMENT_OTHER): Payer: Self-pay | Admitting: Emergency Medicine

## 2022-05-20 ENCOUNTER — Emergency Department (HOSPITAL_BASED_OUTPATIENT_CLINIC_OR_DEPARTMENT_OTHER)
Admission: EM | Admit: 2022-05-20 | Discharge: 2022-05-20 | Disposition: A | Discharge status: Home / Self Care | Payer: Self-pay | Attending: Emergency Medicine | Admitting: Emergency Medicine

## 2022-05-20 DIAGNOSIS — R000 Tachycardia, unspecified: Secondary | ICD-10-CM | POA: Insufficient documentation

## 2022-05-20 DIAGNOSIS — D72829 Elevated white blood cell count, unspecified: Secondary | ICD-10-CM | POA: Insufficient documentation

## 2022-05-20 DIAGNOSIS — N739 Female pelvic inflammatory disease, unspecified: Secondary | ICD-10-CM

## 2022-05-20 LAB — COMPREHENSIVE METABOLIC PANEL
ALT: 11 U/L (ref 0–44)
AST: 12 U/L — ABNORMAL LOW (ref 15–41)
Albumin: 4.5 g/dL (ref 3.5–5.0)
Alkaline Phosphatase: 59 U/L (ref 38–126)
Anion gap: 8 (ref 5–15)
BUN: 12 mg/dL (ref 6–20)
CO2: 22 mmol/L (ref 22–32)
Calcium: 9.3 mg/dL (ref 8.9–10.3)
Chloride: 107 mmol/L (ref 98–111)
Creatinine, Ser: 0.67 mg/dL (ref 0.44–1.00)
GFR, Estimated: >60 mL/min (ref >60)
Glucose, Bld: 99 mg/dL (ref 70–99)
Potassium: 3.7 mmol/L (ref 3.5–5.1)
Sodium: 137 mmol/L (ref 135–145)
Total Bilirubin: 1.2 mg/dL (ref 0.3–1.2)
Total Protein: 7.2 g/dL (ref 6.5–8.1)

## 2022-05-20 LAB — URINALYSIS, ROUTINE W REFLEX MICROSCOPIC
Bacteria, UA: NONE SEEN
Bilirubin Urine: NEGATIVE
Glucose, UA: NEGATIVE mg/dL
Hgb urine dipstick: NEGATIVE
Ketones, ur: NEGATIVE mg/dL
Nitrite: NEGATIVE
Protein, ur: NEGATIVE mg/dL
Specific Gravity, Urine: 1.014 (ref 1.005–1.030)
pH: 7 (ref 5.0–8.0)

## 2022-05-20 LAB — CBC
HCT: 41.9 % (ref 36.0–46.0)
Hemoglobin: 14.4 g/dL (ref 12.0–15.0)
MCH: 32 pg (ref 26.0–34.0)
MCHC: 34.4 g/dL (ref 30.0–36.0)
MCV: 93.1 fL (ref 80.0–100.0)
Platelets: 286 10*3/uL (ref 150–400)
RBC: 4.5 MIL/uL (ref 3.87–5.11)
RDW: 13.2 % (ref 11.5–15.5)
WBC: 21.1 10*3/uL — ABNORMAL HIGH (ref 4.0–10.5)
nRBC: 0 % (ref 0.0–0.2)

## 2022-05-20 LAB — PREGNANCY, URINE: Preg Test, Ur: NEGATIVE

## 2022-05-20 LAB — WET PREP, GENITAL
Clue Cells Wet Prep HPF POC: NONE SEEN
Sperm: NONE SEEN
Trich, Wet Prep: NONE SEEN
WBC, Wet Prep HPF POC: <10 (ref <10)
Yeast Wet Prep HPF POC: NONE SEEN

## 2022-05-20 LAB — LIPASE, BLOOD: Lipase: 12 U/L (ref 11–51)

## 2022-05-20 MED ORDER — METRONIDAZOLE 500 MG PO TABS: 500.0000 mg | ORAL_TABLET | Freq: Two times a day (BID) | ORAL | 0 refills | Status: AC

## 2022-05-20 MED ORDER — LEVOFLOXACIN 500 MG PO TABS: 500.0000 mg | ORAL_TABLET | Freq: Every day | ORAL | 0 refills | Status: AC

## 2022-05-20 NOTE — ED Provider Notes (Signed)
[facility] EMERGENCY DEPT Provider Note   CSN: [id number] Arrival date & time: 05/20/22  1109     History Chief Complaint  Patient presents with   Abdominal Pain    Virginia [name] is a 26 y.o. female.   Abdominal Pain Associated symptoms: nausea   Associated symptoms: no chills, no constipation, no diarrhea, no fatigue, no fever, no shortness of breath and no vomiting   Patient presents to the emergency department complaints of lower abdominal pain for the last several weeks.  She reports that she has noticed an abnormal vaginal discharge with some odor has been present for several days which she reports is consistent with the last time she had bacterial vaginosis.  Patient denies any vaginal pain, dyspareunia, dysuria, hematuria, vomiting, diarrhea.  She states that she tried to use boric acid suppositories to help with this discomfort that she was feeling and states that she had some relief but symptoms have not improved significantly.  Last menstrual period was 05/16/2022 patient does not believe that she could currently be pregnant.  She reports sexual activity with 1 female partner.  Had STI testing done in October 2023 and reports that all that was negative with no need for any treatment at that time.    Home Medications Prior to Admission medications   Medication Sig Start Date End Date Taking? Authorizing Provider  levofloxacin (LEVAQUIN) 500 MG tablet Take 1 tablet (500 mg total) by mouth daily for 14 days. 05/20/22 06/03/22 Yes Smitty Knudsen, PA-C  metroNIDAZOLE (FLAGYL) 500 MG tablet Take 1 tablet (500 mg total) by mouth 2 (two) times daily for 14 days. 05/20/22 06/03/22 Yes Smitty Knudsen, PA-C  acetaminophen (TYLENOL) 500 MG tablet Take 1,000 mg by mouth every 6 (six) hours as needed for mild pain.    [provider]  ALPRAZolam Prudy Feeler) 1 MG tablet Take 2 mg by mouth daily as needed for anxiety.    [provider]  benzonatate (TESSALON) 100  MG capsule Take 1 capsule (100 mg total) by mouth every 8 (eight) hours. 05/30/21   Smoot, Shawn Route, PA-C  ciprofloxacin (CILOXAN) 0.3 % ophthalmic solution Place 2 drops into the right eye every 4 (four) hours while awake. Administer 1 drop, every 2 hours, while awake, for 2 days. Then 1 drop, every 4 hours, while awake, for the next 5 days. 03/01/22   Palumbo, April, MD  methylPREDNISolone (MEDROL DOSEPAK) 4 MG TBPK tablet Take as directed on package 05/30/21   Smoot, Shawn Route, PA-C  metroNIDAZOLE (FLAGYL) 500 MG tablet Take 1 tablet (500 mg total) by mouth 2 (two) times daily. 07/07/18   Gerrit Heck, CNM      Allergies    Ceclor [cefaclor] and Coconut (cocos nucifera)    Review of Systems   Review of Systems  Constitutional:  Negative for chills, fatigue and fever.  Respiratory:  Negative for shortness of breath.   Gastrointestinal:  Positive for abdominal pain and nausea. Negative for constipation, diarrhea and vomiting.  Genitourinary:  Positive for pelvic pain.  All other systems reviewed and are negative.   Physical Exam Updated Vital Signs BP 113/85 (BP Location: Right Arm)   Pulse 89   Temp 98.2 F (36.8 C) (Oral)   Resp 16   Ht 5\' 4"  (1.626 m)   Wt 68 kg   LMP 05/16/2022 (Exact Date)   SpO2 98%   BMI 25.75 kg/m  Physical Exam Vitals and nursing note reviewed. Exam conducted with a chaperone present.  Constitutional:      Appearance: She is well-developed.  HENT:     Head: Normocephalic and atraumatic.  Cardiovascular:     Rate and Rhythm: Regular rhythm. Tachycardia present.     Heart sounds: Normal heart sounds.  Pulmonary:     Effort: Pulmonary effort is normal.     Breath sounds: Normal breath sounds.  Abdominal:     General: Bowel sounds are normal.     Palpations: Abdomen is soft.     Tenderness: There is abdominal tenderness in the right lower quadrant, suprapubic area and left lower quadrant.  Genitourinary:    Vagina: Vaginal discharge and tenderness  present.     Cervix: Discharge present.     Uterus: Tender.      Adnexa:        Right: Tenderness present.        Left: Tenderness present.      Comments: Yellow discharge from cervical os noted. Some slight vaginal discharge noted. Skin:    General: Skin is warm and dry.  Neurological:     General: No focal deficit present.     Mental Status: She is alert.     ED Results / Procedures / Treatments   Labs (all labs ordered are listed, but only abnormal results are displayed) Labs Reviewed  COMPREHENSIVE METABOLIC PANEL - Abnormal; Notable for the following components:      Result Value   AST 12 (*)    All other components within normal limits  CBC - Abnormal; Notable for the following components:   WBC 21.1 (*)    All other components within normal limits  URINALYSIS, ROUTINE W REFLEX MICROSCOPIC - Abnormal; Notable for the following components:   Leukocytes,Ua MODERATE (*)    All other components within normal limits  WET PREP, GENITAL  LIPASE, BLOOD  PREGNANCY, URINE  HIV ANTIBODY (ROUTINE TESTING W REFLEX)  GC/CHLAMYDIA PROBE AMP ([place]) NOT AT [facility]    EKG None  Radiology No results found.  Procedures Procedures   Medications Ordered in ED Medications - No data to display  ED Course/ Medical Decision Making/ A&P                           Medical Decision Making Amount and/or Complexity of Data Reviewed Radiology: ordered.  Risk Prescription drug management.   This patient presents to the ED for concern of lower abdominal pain.  Differential diagnosis includes UTI, cervicitis, pelvic inflammatory disease, appendicitis, diverticulitis.   Lab Tests:  I Ordered, and personally interpreted labs.  The pertinent results include: Leukocytosis of 21.1.  Leukocytes on urine without any evidence of blood or bacteria.  Blood prep did not show yeast, trichomonas, clue cells.    Medicines ordered and prescription drug management:  I have reviewed the  patients home medicines and have made adjustments as needed   Problem List / ED Course:  Patient was seen in the emergency department for concerns of abdominal pain that has been present for several weeks.  She reports that this pain has been worsening over lower abdomen with radiation to both left and right adnexa.  She reports that she is currently sexually active with 1 female partner and has previously been tested for STIs without any recent positive testing requiring any treatment.  She reports that she recently had sex with her female partner and experiencing different pain during this activity.  Discussed with patient concern for possible other causes of  abdominal pain and discomfort and likely need for an ultrasound of the pelvis to rule out any other concerning etiology but patient was adamant that she needed to discharge home she needed to work this evening.  Discussed the risk factors of missing diagnoses such as appendicitis, diverticulitis, bowel perforation, ovarian torsion.  Encouraged patient that if she has symptoms that are persistent even with use of oral antibiotics at home that she should return to emergency department for further evaluation or should be seen by her gynecologist.  Will plan on treating this currently as pelvic inflammatory disease based on physical exam findings of some cervical motion tenderness, adnexal tenderness, and yellow discharge at the cervical os.  Patient has a known allergy toward cefaclor so unable to give dose of Rocephin here in emergency department instead opted for prescription combination of Levaquin and Flagyl for a period of 14 days.  Patient verbalized understand treatment plan and all necessary return precautions.  Patient did not have any further questions at time of discharge.   Final Clinical Impression(s) / ED Diagnoses Final diagnoses:  Pelvic inflammatory disease    Rx / DC Orders ED Discharge Orders          Ordered    levofloxacin  (LEVAQUIN) 500 MG tablet  Daily        05/20/22 1811    metroNIDAZOLE (FLAGYL) 500 MG tablet  2 times daily        05/20/22 1811              Salomon Mast 05/27/22 1207    Virgina Norfolk, DO 05/29/22 1448

## 2022-05-20 NOTE — Discharge Instructions (Signed)
You are seen in the emergency department today and diagnosed with what I believe is the pelvic inflammatory disease.  This diagnosis is based on primarily physical exam and as well as slightly abnormal laboratory testing showing elevated white blood count.  Although some labs are still pending, the treatment for this condition is same regardless of outcome of labs so I decided to discharge to home by request with 2 oral antibiotics that can be taken for a duration of 14 days and should be taken in their entirety.  If you have any worsening symptoms or are concerned about rate of improvement please follow-up with your gynecologist in the outpatient setting.

## 2022-05-20 NOTE — ED Triage Notes (Signed)
C/o vaginal discharge. Thought she had BV. She used 3 boric acid suppositories. Now c/o low abd pain and pelvic pressure after intercourse last night.

## 2022-05-21 LAB — GC/CHLAMYDIA PROBE AMP (~~LOC~~) NOT AT ARMC
Chlamydia: NEGATIVE
Comment: NEGATIVE
Comment: NORMAL
Neisseria Gonorrhea: NEGATIVE

## 2022-05-21 LAB — HIV ANTIBODY (ROUTINE TESTING W REFLEX): HIV Screen 4th Generation wRfx: NONREACTIVE

## 2022-11-08 ENCOUNTER — Other Ambulatory Visit: Payer: Self-pay

## 2022-11-08 ENCOUNTER — Emergency Department: Payer: Medicaid Other

## 2022-11-08 ENCOUNTER — Emergency Department
Admission: EM | Admit: 2022-11-08 | Discharge: 2022-11-08 | Disposition: A | Discharge status: Home / Self Care | Payer: Medicaid Other | Attending: Emergency Medicine | Admitting: Emergency Medicine

## 2022-11-08 ENCOUNTER — Encounter: Payer: Self-pay | Admitting: Emergency Medicine

## 2022-11-08 DIAGNOSIS — N760 Acute vaginitis: Secondary | ICD-10-CM

## 2022-11-08 DIAGNOSIS — N939 Abnormal uterine and vaginal bleeding, unspecified: Secondary | ICD-10-CM

## 2022-11-08 DIAGNOSIS — B9689 Other specified bacterial agents as the cause of diseases classified elsewhere: Secondary | ICD-10-CM | POA: Diagnosis not present

## 2022-11-08 LAB — WET PREP, GENITAL
Trich, Wet Prep: NONE SEEN
WBC, Wet Prep HPF POC: <10 (ref <10)
Yeast Wet Prep HPF POC: NONE SEEN

## 2022-11-08 LAB — CHLAMYDIA/NGC RT PCR (ARMC ONLY)
Chlamydia Tr: NOT DETECTED
N gonorrhoeae: NOT DETECTED

## 2022-11-08 LAB — POC URINE PREG, ED: Preg Test, Ur: NEGATIVE

## 2022-11-08 MED ORDER — METRONIDAZOLE 500 MG PO TABS: 500.0000 mg | ORAL_TABLET | Freq: Two times a day (BID) | ORAL | 0 refills | Status: AC

## 2022-11-08 NOTE — ED Triage Notes (Signed)
  Patient comes in with vaginal bleeding that occurred this morning.  Patient states she had intercourse with her boyfriend this morning before he went to work, got in the shower and noticed some bleeding.  Patient denied any pain at this time.  Patient states she has had several occurrences of vaginal bleeding but not all after intercourse.  Diagnosed with PID a couple months ago.  Hx bacterial vaginosis.  No pain currently.

## 2022-11-08 NOTE — ED Provider Notes (Signed)
[facility] Provider Note    Event Date/Time   First MD Initiated Contact with Patient 11/08/22 [phone]     (approximate)   History   Vaginal Bleeding   HPI  Virginia [name] is a 27 y.o. female with medical history including arthritis  She is here for vaginal bleeding.  She reports intermittently for several months time and she will have small amounts of bleeding, most recently she has had 4 times versus blood counterintuitively seemingly at random throughout the last month.  She does report that she went to urgent care twice admitted to the ER and was diagnosed possibly with PID and completed treatment for that several months ago (Dec 26 23 according to records reviewed by me).  Then she did have abdominal pain.  She is not having abdominal pain  She denies previous history of pregnancy.  She is not really pain or discomfort.  Her bleeding comes on somewhat randomly is not extremely heavy, but she is concerned as to why she has intermittent vaginal bleeding and irregular periods  Today's episode that occurred not long after having intercourse with her boyfriend, but she reports there is no pain or discomfort.  Questionable injury     Physical Exam   Triage Vital Signs: ED Triage Vitals [11/08/22 0645]  Enc Vitals Group     BP 125/88     Pulse Rate (!) 110     Resp 20     Temp 98.3 F (36.8 C)     Temp Source Oral     SpO2 99 %     Weight 150 lb (68 kg)     Height 5\' 4"  (1.626 m)     Head Circumference      Peak Flow      Pain Score 0     Pain Loc      Pain Edu?      Excl. in GC?     Most recent vital signs: Vitals:   11/08/22 0645  BP: 125/88  Pulse: (!) 110  Resp: 20  Temp: 98.3 F (36.8 C)  SpO2: 99%     General: Awake, no distress.  CV:  Good peripheral perfusion.  Resp:  Normal effort.  Abd:  No distention.  Abdomen is soft nontender nondistended throughout. Other:  Pelvic exam performed, escorted by tech only throughout.   Normal internal appearance of the vaginal canal as well as cervical os.  There is no purulence.  There is a very small amount of blood in the posterior vault consistent with spotting but no active hemorrhage.  No cervical tenderness to examination.  Patient reports no pain.   ED Results / Procedures / Treatments   Labs (all labs ordered are listed, but only abnormal results are displayed) Labs Reviewed  WET PREP, GENITAL - Abnormal; Notable for the following components:      Result Value   Clue Cells Wet Prep HPF POC PRESENT (*)    All other components within normal limits  CHLAMYDIA/NGC RT PCR ([facility] ONLY)            POC URINE PREG, ED     EKG     RADIOLOGY  US PELVIC COMPLETE W TRANSVAGINAL AND TORSION R/O  Result Date: 11/08/2022 CLINICAL DATA:  Vaginal bleeding for 1 month, intermittent. EXAM: TRANSABDOMINAL AND TRANSVAGINAL ULTRASOUND OF PELVIS DOPPLER ULTRASOUND OF OVARIES TECHNIQUE: Both transabdominal and transvaginal ultrasound examinations of the pelvis were performed. Transabdominal technique was performed for global imaging of  the pelvis including uterus, ovaries, adnexal regions, and pelvic cul-de-sac. It was necessary to proceed with endovaginal exam following the transabdominal exam to visualize the endometrium. Color and duplex Doppler ultrasound was utilized to evaluate blood flow to the ovaries. COMPARISON:  None Available. FINDINGS: Uterus Measurements: 6.8 x 2.7 x 3.0 cm = volume: 28.7 mL. No fibroids or other mass visualized. Endometrium Thickness: 5 mm.  No focal abnormality visualized. Right ovary Measurements: 3.3 x 2.1 x 1.9 cm = volume: 6.8 mL. Normal appearance/no adnexal mass. Left ovary Measurements: 2.4 x 2.3 x 3.5 cm = volume: 10.1 mL. Normal appearance/no adnexal mass. Other findings No abnormal free fluid. Pulsed Doppler evaluation of both ovaries demonstrates normal low-resistance arterial and venous waveforms. IMPRESSION: 1. No acute process within the  pelvis. 2. Endometrium measures 5 mm. If bleeding remains unresponsive to hormonal or medical therapy, sonohysterogram should be considered for focal lesion work-up. (Ref: Radiological Reasoning: Algorithmic Workup of Abnormal Vaginal Bleeding with Endovaginal Sonography and Sonohysterography. AJR 2008; 161:W96-04) Electronically Signed   By: Annia Belt M.D.   On: 11/08/2022 10:14      PROCEDURES:  Critical Care performed: No  Procedures   MEDICATIONS ORDERED IN ED: Medications - No data to display   IMPRESSION / MDM / ASSESSMENT AND PLAN / ED COURSE  I reviewed the triage vital signs and the nursing notes.                              Differential diagnosis includes, but is not limited to, dysfunctional uterine bleeding, pregnancy, indolant infection, bacterial vaginosis polycystic ovaries, cyst, etc.  Reassuring examination with no associated abdominal pain.  Denies fever chills or purulent drainage.  She describes more rather intermittent months of vaginal bleeding.  She does not appear pale, she describes a wound.  She has pads today.  She does not seem to be experiencing major hemorrhage.  Discussed with patient, will proceed with reevaluation for possible STD vaginosis.  She is also agreeable to receiving ultrasound, it appears she has not had previous ultrasound imaging of the pelvis/ovaries in the past which may be helpful in delineating or excluding causes.  Nontoxic well appearing.  Tell me that she is currently awaiting Medicaid, and anticipates that she will be able to follow-up with the physicians thereafter.  Patient's presentation is most consistent with acute complicated illness / injury requiring diagnostic workup.   Labs interpreted as suspicious for bacterial vaginosis.  Negative gonorrhea chlamydia.  Reassuring ultrasound.  Discussed with patient she is comfortable plan for discharge treatment with metronidazole.  She understands to abstain from alcohol while using  this medication.  She is comfortable with setting up follow-up and have also placed a referral for her to follow-up with OB/GYN.  Discussed careful return precautions including those around vaginal bleeding and/or associated symptoms of abdominal pain/fever etc.  Return precautions and treatment recommendations and follow-up discussed with the patient who is agreeable with the plan.      FINAL CLINICAL IMPRESSION(S) / ED DIAGNOSES   Final diagnoses:  Abnormal uterine bleeding (AUB)  BV (bacterial vaginosis)     Rx / DC Orders   ED Discharge Orders          Ordered    Ambulatory referral to Obstetrics / Gynecology       Comments: ER follow-up. Dysfunctional bleeding   11/08/22 1222    metroNIDAZOLE (FLAGYL) 500 MG tablet  2 times daily  11/08/22 1223             Note:  This document was prepared using Dragon voice recognition software and may include unintentional dictation errors.   Sharyn Creamer, MD 11/08/22 1225

## 2022-11-08 NOTE — ED Notes (Signed)
Dr. Fanny Bien at bedside with [facility] EDT to perform vaginal exam.

## 2022-11-08 NOTE — Discharge Instructions (Signed)
Please follow up closely with obstetrics and gynecology or your primary doctor. ° °Return to the emergency room if your bleeding worsens, you become weak and dizzy or lightheaded, you have an episode of passing out, develop severe bleeding such as more than 1 soaked pad per hour for more than 3 straight hours, develop abdominal or pelvic pain, fevers chills or other new concerns arise.  ° °

## 2022-11-08 NOTE — ED Notes (Signed)
Vaginal exam specimens sent to lab.

## 2022-11-25 ENCOUNTER — Encounter: Payer: Self-pay | Admitting: Obstetrics and Gynecology

## 2023-01-11 ENCOUNTER — Encounter: Payer: Self-pay | Admitting: *Deleted

## 2023-01-11 ENCOUNTER — Other Ambulatory Visit: Payer: Self-pay

## 2023-01-11 ENCOUNTER — Emergency Department
Admission: EM | Admit: 2023-01-11 | Discharge: 2023-01-11 | Disposition: A | Discharge status: Home / Self Care | Payer: Medicaid Other | Attending: Emergency Medicine | Admitting: Emergency Medicine

## 2023-01-11 DIAGNOSIS — A549 Gonococcal infection, unspecified: Secondary | ICD-10-CM | POA: Insufficient documentation

## 2023-01-11 DIAGNOSIS — N898 Other specified noninflammatory disorders of vagina: Secondary | ICD-10-CM | POA: Diagnosis present

## 2023-01-11 LAB — WET PREP, GENITAL
Clue Cells Wet Prep HPF POC: NONE SEEN
Sperm: NONE SEEN
Trich, Wet Prep: NONE SEEN
WBC, Wet Prep HPF POC: >=10 — AB (ref <10)
Yeast Wet Prep HPF POC: NONE SEEN

## 2023-01-11 LAB — URINALYSIS, ROUTINE W REFLEX MICROSCOPIC
Bacteria, UA: NONE SEEN
Bilirubin Urine: NEGATIVE
Glucose, UA: NEGATIVE mg/dL
Hgb urine dipstick: NEGATIVE
Ketones, ur: NEGATIVE mg/dL
Nitrite: NEGATIVE
Protein, ur: NEGATIVE mg/dL
Specific Gravity, Urine: 1.006 (ref 1.005–1.030)
pH: 6 (ref 5.0–8.0)

## 2023-01-11 LAB — CHLAMYDIA/NGC RT PCR (ARMC ONLY)
Chlamydia Tr: NOT DETECTED
N gonorrhoeae: DETECTED — AB

## 2023-01-11 LAB — POC URINE PREG, ED: Preg Test, Ur: NEGATIVE

## 2023-01-11 MED ORDER — CEFTRIAXONE SODIUM 1 G IJ SOLR
500.0000 mg | Freq: Once | INTRAMUSCULAR | Status: AC
  Administered 2023-01-11: 500 mg via INTRAMUSCULAR
  Filled 2023-01-11: qty 10

## 2023-01-11 MED ORDER — DOXYCYCLINE MONOHYDRATE 100 MG PO TABS: 100.0000 mg | ORAL_TABLET | Freq: Two times a day (BID) | ORAL | 0 refills | Status: AC

## 2023-01-11 MED ORDER — LIDOCAINE HCL (PF) 1 % IJ SOLN
INTRAMUSCULAR | Status: AC
  Filled 2023-01-11: qty 5

## 2023-01-11 NOTE — ED Provider Notes (Signed)
[facility] Provider Note    Event Date/Time   First MD Initiated Contact with Patient 01/11/23 [phone]     (approximate)   History   Vaginal Discharge   HPI  Virginia [name] is a 27 y.o. female with a past medical history of trichomonas who presents today with request for STD testing.  Patient reports that she received a text from her ex-boyfriend 2 days ago telling her to get tested.  She is unsure what he has tested positive for.  She reports that she did not have any symptoms until yesterday when she developed white vaginal discharge.  She denies any pelvic discomfort or abdominal pain.  No fevers or chills.  No dysuria.  No nausea or vomiting.  She has not noticed any abnormal lesions.  There are no problems to display for this patient.         Physical Exam   Triage Vital Signs: ED Triage Vitals  Encounter Vitals Group     BP 01/11/23 0456 119/81     Systolic BP Percentile --      Diastolic BP Percentile --      Pulse Rate 01/11/23 0456 85     Resp 01/11/23 0456 14     Temp 01/11/23 0456 98.4 F (36.9 C)     Temp src --      SpO2 01/11/23 0456 97 %     Weight --      Height --      Head Circumference --      Peak Flow --      Pain Score 01/11/23 0454 0     Pain Loc --      Pain Education --      Exclude from Growth Chart --     Most recent vital signs: Vitals:   01/11/23 0456  BP: 119/81  Pulse: 85  Resp: 14  Temp: 98.4 F (36.9 C)  SpO2: 97%    Physical Exam Vitals and nursing note reviewed.  Constitutional:      General: Awake and alert. No acute distress.    Appearance: Normal appearance. The patient is normal weight.  HENT:     Head: Normocephalic and atraumatic.     Mouth: Mucous membranes are moist.  Eyes:     General: PERRL. Normal EOMs        Right eye: No discharge.        Left eye: No discharge.     Conjunctiva/sclera: Conjunctivae normal.  Cardiovascular:     Rate and Rhythm: Normal rate and regular  rhythm.     Pulses: Normal pulses.     Heart sounds: Normal heart sounds Pulmonary:     Effort: Pulmonary effort is normal. No respiratory distress.     Breath sounds: Normal breath sounds.  Abdominal:     Abdomen is soft. There is no abdominal tenderness. No rebound or guarding. No distention. Musculoskeletal:        General: No swelling. Normal range of motion.     Cervical back: Normal range of motion and neck supple.  Skin:    General: Skin is warm and dry.     Capillary Refill: Capillary refill takes less than 2 seconds.     Findings: No rash.  Neurological:     Mental Status: The patient is awake and alert.      ED Results / Procedures / Treatments   Labs (all labs ordered are listed, but only abnormal results are displayed)  Labs Reviewed  WET PREP, GENITAL - Abnormal; Notable for the following components:      Result Value   WBC, Wet Prep HPF POC >=10 (*)    All other components within normal limits  CHLAMYDIA/NGC RT PCR ([facility] ONLY)           - Abnormal; Notable for the following components:   N gonorrhoeae DETECTED (*)    All other components within normal limits  URINALYSIS, ROUTINE W REFLEX MICROSCOPIC - Abnormal; Notable for the following components:   Color, Urine STRAW (*)    APPearance HAZY (*)    Leukocytes,Ua TRACE (*)    All other components within normal limits  POC URINE PREG, ED     EKG     RADIOLOGY     PROCEDURES:  Critical Care performed:   Procedures   MEDICATIONS ORDERED IN ED: Medications  cefTRIAXone (ROCEPHIN) injection 500 mg (500 mg Intramuscular Given 01/11/23 0949)  lidocaine (PF) (XYLOCAINE) 1 % injection (  Given 01/11/23 0953)     IMPRESSION / MDM / ASSESSMENT AND PLAN / ED COURSE  I reviewed the triage vital signs and the nursing notes.   Differential diagnosis includes, but is not limited to, STD, UTI, yeast, BV.  Patient is awake and alert, hemodynamically stable and afebrile.  She is nontoxic in appearance.   She has no abdominal tenderness or complaints of pelvic discomfort.  She was given the option of pelvic exam versus self swab, understands that I might miss diagnoses such as PID, genital lesions, or other abnormalities without doing a pelvic exam, but she opts to self swab.  Urinalysis and urine pregnancy obtained in triage.  Urine pregnancy is negative, urinalysis reveals leukocytes.  She declines HIV and RPR testing.  She denies abdominal pain, pelvic pain, or dyspareunia to suggest PID or TOA.  Self swab is positive for gonorrhea.  Patient reportedly has an allergy to Ceclor.  I discussed Rocephin use with pharmacist, Trinna Post who reports that this has a different side chain and should be okay to give the patient.  Patient was comfortable with this plan.  She was given IM Rocephin and monitored for 1 hour post injection to ensure no allergic reaction.  She tolerated this well and had no subsequent rash or allergic reaction.  She was also given a prescription for doxycycline.   Patient was advised that there are many other STDs that patient could have, that we do not test for in the emergency department. Patient was advised to follow up with a primary care doctor or the health department to have the full panel of testing performed. Patient was advised to not have sexual intercourse until fully and properly tested and treated. Patient was advised that his partner also needs to be tested and treated. Patient understands and agrees with plan. She was discharged in stable condition.   Patient's presentation is most consistent with acute complicated illness / injury requiring diagnostic workup.   Clinical Course as of 01/11/23 1131  Sun Jan 11, 2023  1610 Discussed with Trinna Post, pharmacist who reports that Rocephin has a different side chain than Ceclor and feels that this is still appropriate management and not risky from an allergy standpoint [JP]  1038 Patient did not have any adverse reaction to the  ceftriaxone.  No itching or hives or rash etc.  She was monitored for 1 hour post administration [JP]    Clinical Course User Index [JP] Ranetta Armacost, Herb Grays, PA-C     FINAL  CLINICAL IMPRESSION(S) / ED DIAGNOSES   Final diagnoses:  Gonorrhea     Rx / DC Orders   ED Discharge Orders          Ordered    doxycycline (ADOXA) 100 MG tablet  2 times daily        01/11/23 1037             Note:  This document was prepared using Dragon voice recognition software and may include unintentional dictation errors.   Keturah Shavers 01/11/23 1131    Sharman Cheek, MD 01/11/23 [phone]

## 2023-01-11 NOTE — Discharge Instructions (Signed)
You have tested positive for gonorrhea.  Please take the antibiotic as prescribed and you are also given an antibiotic in the emergency department. Remember that there are many other STDs that and you should follow up with a primary care doctor to have the full panel of testing performed. Please do not have sexual intercourse until fully and properly tested and treated. Your partner also needs to be tested and treated.  Please return for any new, worsening, or change in symptoms or other concerns.  It was a pleasure caring for you today.

## 2023-01-11 NOTE — ED Triage Notes (Signed)
Pt reports white vaginal discharge that started this morning with itching. Wants STD testing.
